# Patient Record
Sex: Female | Born: 1975 | Race: White | Hispanic: No | Marital: Married | Smoking: Former smoker
Health system: Southern US, Community
[De-identification: ages and names within clinical notes are randomized; demographics above are authoritative.]

## PROBLEM LIST (undated history)

## (undated) DIAGNOSIS — F32A Depression, unspecified: Secondary | ICD-10-CM

## (undated) DIAGNOSIS — L409 Psoriasis, unspecified: Secondary | ICD-10-CM

## (undated) DIAGNOSIS — F329 Major depressive disorder, single episode, unspecified: Secondary | ICD-10-CM

## (undated) HISTORY — DX: Psoriasis, unspecified: L40.9

## (undated) HISTORY — PX: TUBAL LIGATION: SHX77

---

## 1993-02-26 HISTORY — PX: LEEP: SHX91

## 2002-12-30 ENCOUNTER — Emergency Department (HOSPITAL_COMMUNITY): Admission: EM | Admit: 2002-12-30 | Discharge: 2002-12-30 | Payer: Self-pay | Admitting: *Deleted

## 2004-12-12 DIAGNOSIS — D229 Melanocytic nevi, unspecified: Secondary | ICD-10-CM

## 2004-12-12 HISTORY — DX: Melanocytic nevi, unspecified: D22.9

## 2005-02-19 ENCOUNTER — Emergency Department (HOSPITAL_COMMUNITY): Admission: EM | Admit: 2005-02-19 | Discharge: 2005-02-19 | Payer: Self-pay | Admitting: Emergency Medicine

## 2011-11-30 ENCOUNTER — Encounter (HOSPITAL_COMMUNITY): Payer: Self-pay

## 2011-11-30 ENCOUNTER — Encounter (HOSPITAL_COMMUNITY)
Admission: RE | Admit: 2011-11-30 | Discharge: 2011-11-30 | Disposition: A | Discharge status: Home / Self Care | Payer: BC Managed Care – PPO | Source: Ambulatory Visit | Attending: Obstetrics and Gynecology | Admitting: Obstetrics and Gynecology

## 2011-11-30 ENCOUNTER — Other Ambulatory Visit: Payer: Self-pay | Admitting: Obstetrics and Gynecology

## 2011-11-30 HISTORY — DX: Depression, unspecified: F32.A

## 2011-11-30 HISTORY — DX: Major depressive disorder, single episode, unspecified: F32.9

## 2011-11-30 LAB — URINALYSIS, ROUTINE W REFLEX MICROSCOPIC
Bilirubin Urine: NEGATIVE
Hgb urine dipstick: NEGATIVE
Nitrite: NEGATIVE
Specific Gravity, Urine: 1.01 (ref 1.005–1.030)
pH: 6 (ref 5.0–8.0)

## 2011-11-30 LAB — BASIC METABOLIC PANEL
CO2: 26 mEq/L (ref 19–32)
Chloride: 104 mEq/L (ref 96–112)
Glucose, Bld: 131 mg/dL — ABNORMAL HIGH (ref 70–99)
Potassium: 4.1 mEq/L (ref 3.5–5.1)
Sodium: 138 mEq/L (ref 135–145)

## 2011-11-30 LAB — CBC
HCT: 33.9 % — ABNORMAL LOW (ref 36.0–46.0)
Hemoglobin: 11.1 g/dL — ABNORMAL LOW (ref 12.0–15.0)
MCH: 29.9 pg (ref 26.0–34.0)
MCV: 91.4 fL (ref 78.0–100.0)
RBC: 3.71 MIL/uL — ABNORMAL LOW (ref 3.87–5.11)

## 2011-11-30 LAB — SURGICAL PCR SCREEN: Staphylococcus aureus: POSITIVE — AB

## 2011-11-30 LAB — HCG, SERUM, QUALITATIVE: Preg, Serum: NEGATIVE

## 2011-11-30 NOTE — Patient Instructions (Addendum)
20 Kristin Chang  11/30/2011   Your procedure is scheduled on:  12/03/2011  Report to [facility] at 1100 AM.  Call this number if you have problems the morning of surgery: [phone]   Remember:   Do not eat food:After Midnight.  May have clear liquids:until Midnight .  Clear liquids include soda, tea, black coffee, apple or grape juice, broth.  Take these medicines the morning of surgery with A SIP OF WATER: none   Do not wear jewelry, make-up or nail polish.  Do not wear lotions, powders, or perfumes. You may wear deodorant.  Do not shave 48 hours prior to surgery. Men may shave face and neck.  Do not bring valuables to the hospital.  Contacts, dentures or bridgework may not be worn into surgery.  Leave suitcase in the car. After surgery it may be brought to your room.  For patients admitted to the hospital, checkout time is 11:00 AM the day of discharge.   Patients discharged the day of surgery will not be allowed to drive home.  Name and phone number of your driver: mother Kristin Chang  Special Instructions: Shower using CHG 2 nights before surgery and the night before surgery.  If you shower the day of surgery use CHG.  Use special wash - you have one bottle of CHG for all showers.  You should use approximately 1/3 of the bottle for each shower.   Please read over the following fact sheets that you were given: Surgical Site Infection Prevention

## 2011-11-30 NOTE — H&P (Addendum)
Kristin Chang is an 36 y.o. female. At this time for resection of a prolapsed I. Leavy Cella which is protruding through the cervix. She will then have hysteroscopy to ensure that the fibroid and any associated polyp is removed. And curettage of the endometrial cavity will be performed. She presented to our office 11/30/2011 complaining of watery vaginal discharge of 3 days' duration. She's had a history of fibroids in the uterus based on ultrasound done at a [facility] last year. She's had an effort at using the IUD which failed. Due to cramping and discomfort. It may be that the fibroid was reason this failed. Additionally, after discussion, she desired tubal ligation to be performed at the same surgery, to allow for permanent sterilization.Failure rate quoted at 1:100. Pertinent Gynecological History: Menses: flow is excessive with use of Both pads or tampons on heaviest days Bleeding: Light pink discharge last 3 days Contraception: none DES exposure: unknown Blood transfusions: none Sexually transmitted diseases: no past history Previous GYN Procedures: IUD use and passed x5 years with good success  Last mammogram: Not require Date:  Last pap: normal Date: 2012 OB History: G1, P1   Menstrual History: Menarche age: 62 No LMP recorded.    No past medical history on file.  No past surgical history on file.  No family history on file.  Social History:  does not have a smoking history on file. She does not have any smokeless tobacco history on file. Her alcohol and drug histories not on file.  Allergies: Allergies not on file  No prescriptions prior to admission    Review of Systems  Constitutional: Negative.     There were no vitals taken for this visit. Physical Exam  Constitutional: She is oriented to person, place, and time. She appears well-developed and well-nourished.  HENT:  Head: Normocephalic and atraumatic.  Eyes: Pupils are equal, round, and reactive to light.    Cardiovascular: Normal rate and regular rhythm.   Respiratory: Effort normal.  GI: Soft. She exhibits no distension. There is no tenderness. There is no guarding.  Genitourinary: Vagina normal and uterus normal.       Large 5cmx5cm fibroid prolapsed through the cervix Uterus difficult to assess due to fibroid  Neurological: She is alert and oriented to person, place, and time. She has normal reflexes.  Psychiatric: She has a normal mood and affect. Her behavior is normal. Judgment and thought content normal.    No results found for this or any previous visit (from the past 24 hour(s)).  No results found.  Assessment/Plan: Prolapsed endometrial fibroid, 4 hysteroscopy excision of polyp and dilation and curettage on Monday, 12/02/2028  Nigil Braman V 11/30/2011, 2:14 PM

## 2011-12-03 ENCOUNTER — Ambulatory Visit (HOSPITAL_COMMUNITY)
Admission: RE | Admit: 2011-12-03 | Discharge: 2011-12-03 | Disposition: A | Discharge status: Home / Self Care | Payer: BC Managed Care – PPO | Source: Ambulatory Visit | Attending: Obstetrics and Gynecology | Admitting: Obstetrics and Gynecology

## 2011-12-03 ENCOUNTER — Encounter (HOSPITAL_COMMUNITY): Payer: Self-pay | Admitting: *Deleted

## 2011-12-03 ENCOUNTER — Ambulatory Visit (HOSPITAL_COMMUNITY): Payer: BC Managed Care – PPO | Admitting: Anesthesiology

## 2011-12-03 ENCOUNTER — Encounter (HOSPITAL_COMMUNITY): Payer: Self-pay | Admitting: Anesthesiology

## 2011-12-03 ENCOUNTER — Encounter (HOSPITAL_COMMUNITY)
Admission: RE | Disposition: A | Discharge status: Home / Self Care | Payer: Self-pay | Source: Ambulatory Visit | Attending: Obstetrics and Gynecology

## 2011-12-03 DIAGNOSIS — Z302 Encounter for sterilization: Secondary | ICD-10-CM

## 2011-12-03 DIAGNOSIS — D260 Other benign neoplasm of cervix uteri: Secondary | ICD-10-CM | POA: Insufficient documentation

## 2011-12-03 DIAGNOSIS — D259 Leiomyoma of uterus, unspecified: Secondary | ICD-10-CM | POA: Diagnosis present

## 2011-12-03 HISTORY — PX: LAPAROSCOPIC TUBAL LIGATION: SHX1937

## 2011-12-03 HISTORY — PX: HYSTEROSCOPY W/D&C: SHX1775

## 2011-12-03 SURGERY — DILATATION AND CURETTAGE /HYSTEROSCOPY
Anesthesia: General | Site: Vagina | Wound class: Clean Contaminated

## 2011-12-03 MED ORDER — PROPOFOL 10 MG/ML IV EMUL
INTRAVENOUS | Status: AC
  Filled 2011-12-03: qty 20

## 2011-12-03 MED ORDER — FENTANYL CITRATE 0.05 MG/ML IJ SOLN
INTRAMUSCULAR | Status: AC
  Filled 2011-12-03: qty 5

## 2011-12-03 MED ORDER — FENTANYL CITRATE 0.05 MG/ML IJ SOLN
INTRAMUSCULAR | Status: AC
  Filled 2011-12-03: qty 2

## 2011-12-03 MED ORDER — ONDANSETRON HCL 4 MG/2ML IJ SOLN: 4.0000 mg | Freq: Once | INTRAMUSCULAR | Status: DC | PRN

## 2011-12-03 MED ORDER — CEFAZOLIN SODIUM-DEXTROSE 2-3 GM-% IV SOLR
2.0000 g | INTRAVENOUS | Status: AC
  Administered 2011-12-03: 2 g via INTRAVENOUS

## 2011-12-03 MED ORDER — ROCURONIUM BROMIDE 100 MG/10ML IV SOLN
INTRAVENOUS | Status: DC | PRN
  Administered 2011-12-03: 35 mg via INTRAVENOUS

## 2011-12-03 MED ORDER — LIDOCAINE HCL (PF) 1 % IJ SOLN
INTRAMUSCULAR | Status: AC
  Filled 2011-12-03: qty 5

## 2011-12-03 MED ORDER — SODIUM CHLORIDE 0.9 % IR SOLN
Status: DC | PRN
  Administered 2011-12-03: 1000 mL

## 2011-12-03 MED ORDER — BUPIVACAINE-EPINEPHRINE PF 0.5-1:200000 % IJ SOLN
INTRAMUSCULAR | Status: AC
  Filled 2011-12-03: qty 10

## 2011-12-03 MED ORDER — LACTATED RINGERS IV SOLN
INTRAVENOUS | Status: DC
  Administered 2011-12-03: 12:00:00 via INTRAVENOUS

## 2011-12-03 MED ORDER — ONDANSETRON HCL 4 MG/2ML IJ SOLN
4.0000 mg | Freq: Once | INTRAMUSCULAR | Status: AC
  Administered 2011-12-03: 4 mg via INTRAVENOUS

## 2011-12-03 MED ORDER — FENTANYL CITRATE 0.05 MG/ML IJ SOLN
25.0000 ug | INTRAMUSCULAR | Status: DC | PRN
  Administered 2011-12-03 (×2): 50 ug via INTRAVENOUS

## 2011-12-03 MED ORDER — LIDOCAINE HCL 1 % IJ SOLN
INTRAMUSCULAR | Status: DC | PRN
  Administered 2011-12-03: 50 mg via INTRADERMAL

## 2011-12-03 MED ORDER — ROCURONIUM BROMIDE 50 MG/5ML IV SOLN
INTRAVENOUS | Status: AC
  Filled 2011-12-03: qty 1

## 2011-12-03 MED ORDER — FENTANYL CITRATE 0.05 MG/ML IJ SOLN
INTRAMUSCULAR | Status: DC | PRN
  Administered 2011-12-03: 50 ug via INTRAVENOUS
  Administered 2011-12-03: 150 ug via INTRAVENOUS
  Administered 2011-12-03 (×2): 25 ug via INTRAVENOUS

## 2011-12-03 MED ORDER — MUPIROCIN 2 % EX OINT
TOPICAL_OINTMENT | CUTANEOUS | Status: AC
  Filled 2011-12-03: qty 22

## 2011-12-03 MED ORDER — ONDANSETRON HCL 4 MG/2ML IJ SOLN
INTRAMUSCULAR | Status: AC
  Filled 2011-12-03: qty 2

## 2011-12-03 MED ORDER — MIDAZOLAM HCL 2 MG/2ML IJ SOLN
INTRAMUSCULAR | Status: AC
  Filled 2011-12-03: qty 2

## 2011-12-03 MED ORDER — LACTATED RINGERS IV SOLN
INTRAVENOUS | Status: DC | PRN
  Administered 2011-12-03 (×2): via INTRAVENOUS

## 2011-12-03 MED ORDER — GLYCOPYRROLATE 0.2 MG/ML IJ SOLN
INTRAMUSCULAR | Status: AC
  Filled 2011-12-03: qty 2

## 2011-12-03 MED ORDER — SODIUM CHLORIDE 0.9 % IR SOLN
Status: DC | PRN
  Administered 2011-12-03: 3000 mL

## 2011-12-03 MED ORDER — IBUPROFEN 600 MG PO TABS: 600.0000 mg | ORAL_TABLET | Freq: Four times a day (QID) | ORAL | Status: DC | PRN

## 2011-12-03 MED ORDER — GLYCOPYRROLATE 0.2 MG/ML IJ SOLN
INTRAMUSCULAR | Status: DC | PRN
  Administered 2011-12-03: 0.2 mg via INTRAVENOUS
  Administered 2011-12-03: .4 mg via INTRAVENOUS

## 2011-12-03 MED ORDER — OXYCODONE-ACETAMINOPHEN 5-325 MG PO TABS: 1.0000 | ORAL_TABLET | ORAL | Status: DC | PRN

## 2011-12-03 MED ORDER — CEFAZOLIN SODIUM-DEXTROSE 2-3 GM-% IV SOLR
INTRAVENOUS | Status: AC
  Filled 2011-12-03: qty 50

## 2011-12-03 MED ORDER — PROPOFOL 10 MG/ML IV EMUL
INTRAVENOUS | Status: DC | PRN
  Administered 2011-12-03: 180 mg via INTRAVENOUS

## 2011-12-03 MED ORDER — NEOSTIGMINE METHYLSULFATE 1 MG/ML IJ SOLN
INTRAMUSCULAR | Status: AC
  Filled 2011-12-03: qty 10

## 2011-12-03 MED ORDER — BUPIVACAINE-EPINEPHRINE PF 0.5-1:200000 % IJ SOLN
INTRAMUSCULAR | Status: DC | PRN
  Administered 2011-12-03: 15 mL

## 2011-12-03 MED ORDER — MIDAZOLAM HCL 2 MG/2ML IJ SOLN
1.0000 mg | INTRAMUSCULAR | Status: DC | PRN
  Administered 2011-12-03: 2 mg via INTRAVENOUS

## 2011-12-03 MED ORDER — NEOSTIGMINE METHYLSULFATE 1 MG/ML IJ SOLN
INTRAMUSCULAR | Status: DC | PRN
  Administered 2011-12-03: 3 mg via INTRAVENOUS

## 2011-12-03 SURGICAL SUPPLY — 37 items
BAG HAMPER (MISCELLANEOUS) ×3 IMPLANT
BLADE SURG SZ11 CARB STEEL (BLADE) ×2 IMPLANT
CLOTH BEACON ORANGE TIMEOUT ST (SAFETY) ×3 IMPLANT
COVER LIGHT HANDLE STERIS (MISCELLANEOUS) ×6 IMPLANT
COVER MAYO STAND XLG (DRAPE) ×1 IMPLANT
DECANTER SPIKE VIAL GLASS SM (MISCELLANEOUS) ×3 IMPLANT
DRESSING COVERLET 3X1 FLEXIBLE (GAUZE/BANDAGES/DRESSINGS) ×2 IMPLANT
FALOPE-RING BAND KIT ×1 IMPLANT
FORMALIN 10 PREFIL 120ML (MISCELLANEOUS) ×3 IMPLANT
GLOVE BIOGEL PI IND STRL 7.5 (GLOVE) IMPLANT
GLOVE BIOGEL PI INDICATOR 7.5 (GLOVE) ×1
GLOVE ECLIPSE 7.0 STRL STRAW (GLOVE) ×2 IMPLANT
GLOVE ECLIPSE 9.0 STRL (GLOVE) ×4 IMPLANT
GLOVE EXAM NITRILE MD LF STRL (GLOVE) ×2 IMPLANT
GLOVE INDICATOR STER SZ 9 (GLOVE) ×5 IMPLANT
GOWN STRL REIN 3XL LVL4 (GOWN DISPOSABLE) ×4 IMPLANT
GOWN STRL REIN XL XLG (GOWN DISPOSABLE) ×3 IMPLANT
INST SET HYSTEROSCOPY (KITS) ×3 IMPLANT
IV NS IRRIG 3000ML ARTHROMATIC (IV SOLUTION) ×3 IMPLANT
KIT ROOM TURNOVER APOR (KITS) ×3 IMPLANT
MANIFOLD NEPTUNE II (INSTRUMENTS) ×3 IMPLANT
NS IRRIG 1000ML POUR BTL (IV SOLUTION) ×3 IMPLANT
PACK PERI GYN (CUSTOM PROCEDURE TRAY) ×3 IMPLANT
PAD ARMBOARD 7.5X6 YLW CONV (MISCELLANEOUS) ×3 IMPLANT
PAD TELFA 3X4 1S STER (GAUZE/BANDAGES/DRESSINGS) ×3 IMPLANT
RING FALLOPIAN BANDS (Ring) ×2 IMPLANT
SET BASIN LINEN APH (SET/KITS/TRAYS/PACK) ×3 IMPLANT
SET CYSTO W/LG BORE CLAMP LF (SET/KITS/TRAYS/PACK) ×3 IMPLANT
SOLUTION ANTI FOG 6CC (MISCELLANEOUS) ×2 IMPLANT
STRIP CLOSURE SKIN 1/4X3 (GAUZE/BANDAGES/DRESSINGS) ×2 IMPLANT
SUT VIC AB 4-0 PS2 27 (SUTURE) ×2 IMPLANT
SYR BULB IRRIGATION 50ML (SYRINGE) ×1 IMPLANT
SYR CONTROL 10ML LL (SYRINGE) ×2 IMPLANT
SYRINGE 10CC LL (SYRINGE) ×2 IMPLANT
TROCAR KII 8X100ML NONTHREADED (TROCAR) ×1 IMPLANT
TROCAR Z-THREAD FIOS 5X100MM (TROCAR) ×1 IMPLANT
TUBING INSUFFLATION HIGH FLOW (TUBING) ×1 IMPLANT

## 2011-12-03 NOTE — Op Note (Signed)
See operative details included in brief operative note 

## 2011-12-03 NOTE — Preoperative (Signed)
Beta Blockers   Reason not to administer Beta Blockers:Not Applicable 

## 2011-12-03 NOTE — Anesthesia Preprocedure Evaluation (Addendum)
Anesthesia Evaluation  Patient identified by MRN, date of birth, ID band Patient awake    Reviewed: Allergy & Precautions, H&P , NPO status , Patient's Chart, lab work & pertinent test results  Airway Mallampati: I TM Distance: >3 FB Neck ROM: Full    Dental  (+) Teeth Intact and Implants,    Pulmonary neg pulmonary ROS,  breath sounds clear to auscultation        Cardiovascular negative cardio ROS  Rhythm:Regular     Neuro/Psych PSYCHIATRIC DISORDERS Depression    GI/Hepatic negative GI ROS,   Endo/Other    Renal/GU      Musculoskeletal   Abdominal   Peds  Hematology   Anesthesia Other Findings   Reproductive/Obstetrics                           Anesthesia Physical Anesthesia Plan  ASA: II  Anesthesia Plan: General   Post-op Pain Management:    Induction: Intravenous  Airway Management Planned: Oral ETT  Additional Equipment:   Intra-op Plan:   Post-operative Plan: Extubation in OR  Informed Consent: I have reviewed the patients History and Physical, chart, labs and discussed the procedure including the risks, benefits and alternatives for the proposed anesthesia with the patient or authorized representative who has indicated his/her understanding and acceptance.     Plan Discussed with:   Anesthesia Plan Comments:         Anesthesia Quick Evaluation

## 2011-12-03 NOTE — Anesthesia Postprocedure Evaluation (Signed)
  Anesthesia Post-op Note  Patient: Kristin Chang  Procedure(s) Performed: Procedure(s) (LRB) with comments: DILATATION AND CURETTAGE /HYSTEROSCOPY (N/A) - Hysteroscopy with excision of prolapsed cervical fibroid, done at 1342 LAPAROSCOPIC TUBAL LIGATION (N/A) - started at 1346  Patient Location: PACU  Anesthesia Type: General  Level of Consciousness: awake, alert  and oriented  Airway and Oxygen Therapy: Patient Spontanous Breathing and Patient connected to face mask oxygen  Post-op Pain: mild  Post-op Assessment: Post-op Vital signs reviewed, Patient's Cardiovascular Status Stable, Respiratory Function Stable, Patent Airway and No signs of Nausea or vomiting  Post-op Vital Signs: Reviewed and stable  Complications: No apparent anesthesia complications

## 2011-12-03 NOTE — Brief Op Note (Signed)
12/03/2011  2:21 PM  PATIENT:  Kristin Chang  35 y.o. female  PRE-OPERATIVE DIAGNOSIS:  cervical fibroid, prolapsed. Desire for sterilization    POST-OPERATIVE DIAGNOSIS:  cervical fibroid, prolapsed,. Desire for sterilization.  PROCEDURE:  Procedure(s) (LRB) with comments: DILATATION AND CURETTAGE /HYSTEROSCOPY (N/A) - Hysteroscopy with excision of prolapsed cervical fibroid, done at 1342 LAPAROSCOPIC TUBAL LIGATION (N/A) - started at 1346  SURGEON:  Surgeon(s) and Role:    * Tilda Burrow, MD - Primary  PHYSICIAN ASSISTANT:  ASSISTANTS: [place] CST  ANESTHESIA:   local and general  EBL:  Total I/O In: 1000 [I.V.:1000] Out: -  BLOOD ADMINISTERED:none DRAINS: none   LOCAL MEDICATIONS USED:  MARCAINE    and Amount: 20 ml SPECIMEN:  Source of Specimen:  Prolapsed fibroid  DISPOSITION OF SPECIMEN:  PATHOLOGY COUNTS:  YES  TOURNIQUET:  * No tourniquets in log * DICTATION: .Dragon Dictation Patient was taken to the operating room prepped and draped for combined abdominal and vaginal procedure timeout conducted and antibiotic administered. 2 g Ancef IV were administered. Attention was first directed to the vaginal area where speculum was inserted, cervix grasped behind the large prolapsed fibroid, and the hysteroscope slid past the fibroid in order to try to identify the size and orientation of the suspected fibroid stalk. Due to technical difficulties it was not very helpful to try to use hysteroscope. It was felt that the uterine cavity was relatively short period. Using this information we're able to grasp the fibroid with single-tooth tenaculum, and use Metzenbaum scissors to delineate the upper most portion of the polyp which appeared to be coming from the fundal portion of the uterus, and in a careful intrauterine transection of the broad-based stalk. Specimen was passed off as surgical specimen, and hysteroscope once again attempted to try to further delineate the uterine  cavity. There was a moderate amount of bleeding from the base of the fibroid stalk, but intermittently adequate visualization allowed [place] to ensure that there was no suspicion of perforation. The right tubal ostia was visualized, but the left could not be visualized.  The vaginal portion of procedure was considered complete.  The cervix was grasped with ring forceps and single-tooth tenaculum for uterine manipulation, then the abdominal portion the case initiated. Surgeon redraped and repositioned to the abdominal site where an infraumbilical vertical 1 cm skin incision was made as well as a  transversel 8 millimeter abdominal skin incision performed. The direct insertion technique was used with the umbilical trocar with care due to patient's limb body habitus, and the peritoneal cavity was easily entered oriented and the trocar toward the pelvis, below the sacral promontory. Abdominal contents were inspected carefully and no evidence of bleeding identified. The suprapubic trocar was placed by direct visualization, and attention to the pelvis then showed a irregular fibroid uterus relatively short and broad in dimensions, with both a fundal submucosal fibroid and smaller  multiple fibroids. The uterus could be manipulated sufficiently to see the adnexal structures with the fallopian tube was visualized to their fimbriated end and ovaries visualized as grossly normal. The uterus was not particularly mobile. Falope ring was applied to the midportion of each tube and infiltrated percutaneously with Marcaine solution 2 cc on each side. Deflation the abdomen and instilling 125 cc of saline was then performed then the subcutaneous closure the skin incisions performed completing the procedure. Sponge and needle counts were correct patient to recovery room in stable condition .   PLAN OF CARE: Discharge to home  after PACU PATIENT DISPOSITION:  PACU - hemodynamically stable.   Delay start of Pharmacological VTE agent  (>24hrs) due to surgical blood loss or risk of bleeding: not applicable

## 2011-12-03 NOTE — Interval H&P Note (Signed)
History and Physical Interval Note:  12/03/2011 12:53 PM  Kristin Chang  has presented today for surgery, with the diagnosis of cervical fibroid, prolapsed  The various methods of treatment have been discussed with the patient and family. After consideration of risks, benefits and other options for treatment, the patient has consented to  Procedure(s) (LRB) with comments: DILATATION AND CURETTAGE /HYSTEROSCOPY (N/A) - Hysteroscopy with excision of prolapsed cervical fibroid as a surgical intervention .  The patient's history has been reviewed, patient examined, no change in status, stable for surgery.  I have reviewed the patient's chart and labs.  Questions were answered to the patient's satisfaction.    Tubal ligation has been added to the procedure, as per pt request on Friday. Tilda Burrow

## 2011-12-03 NOTE — Transfer of Care (Signed)
Immediate Anesthesia Transfer of Care Note  Patient: Kristin Chang  Procedure(s) Performed: Procedure(s) (LRB) with comments: DILATATION AND CURETTAGE /HYSTEROSCOPY (N/A) - Hysteroscopy with excision of prolapsed cervical fibroid, done at 1342 LAPAROSCOPIC TUBAL LIGATION (N/A) - started at 1346  Patient Location: PACU  Anesthesia Type: General  Level of Consciousness: awake, alert  and oriented  Airway & Oxygen Therapy: Patient Spontanous Breathing and Patient connected to face mask oxygen  Post-op Assessment: Report given to PACU RN  Post vital signs: Reviewed  Complications: No apparent anesthesia complications

## 2011-12-03 NOTE — Anesthesia Postprocedure Evaluation (Signed)
  Anesthesia Post-op Note  Patient: Kristin Chang  Procedure(s) Performed: Procedure(s) (LRB) with comments: DILATATION AND CURETTAGE /HYSTEROSCOPY (N/A) - Hysteroscopy with excision of prolapsed cervical fibroid, done at 1342 LAPAROSCOPIC TUBAL LIGATION (N/A) - started at 1346  Patient Location: PACU  Anesthesia Type: General  Level of Consciousness: awake  Airway and Oxygen Therapy: Patient Spontanous Breathing and Patient connected to face mask oxygen  Post-op Pain: mild  Post-op Assessment: Post-op Vital signs reviewed, Patient's Cardiovascular Status Stable, Respiratory Function Stable, Patent Airway and No signs of Nausea or vomiting  Post-op Vital Signs: Reviewed and stable  Complications: No apparent anesthesia complications

## 2011-12-05 ENCOUNTER — Encounter (HOSPITAL_COMMUNITY): Payer: Self-pay | Admitting: Obstetrics and Gynecology

## 2012-05-16 ENCOUNTER — Other Ambulatory Visit: Payer: Self-pay | Admitting: Obstetrics and Gynecology

## 2012-05-19 ENCOUNTER — Other Ambulatory Visit: Payer: Self-pay | Admitting: Obstetrics and Gynecology

## 2012-05-19 ENCOUNTER — Encounter: Payer: Self-pay | Admitting: Obstetrics and Gynecology

## 2012-05-19 ENCOUNTER — Other Ambulatory Visit (HOSPITAL_COMMUNITY)
Admission: RE | Admit: 2012-05-19 | Discharge: 2012-05-19 | Disposition: A | Discharge status: Home / Self Care | Payer: BC Managed Care – PPO | Source: Ambulatory Visit | Attending: Obstetrics and Gynecology | Admitting: Obstetrics and Gynecology

## 2012-05-19 ENCOUNTER — Ambulatory Visit (INDEPENDENT_AMBULATORY_CARE_PROVIDER_SITE_OTHER): Payer: BC Managed Care – PPO | Admitting: Obstetrics and Gynecology

## 2012-05-19 VITALS — BP 116/74 | Resp 16 | Ht 66.0 in | Wt 136.6 lb

## 2012-05-19 DIAGNOSIS — Z1151 Encounter for screening for human papillomavirus (HPV): Secondary | ICD-10-CM | POA: Insufficient documentation

## 2012-05-19 DIAGNOSIS — Z0000 Encounter for general adult medical examination without abnormal findings: Secondary | ICD-10-CM

## 2012-05-19 DIAGNOSIS — Z01419 Encounter for gynecological examination (general) (routine) without abnormal findings: Secondary | ICD-10-CM | POA: Insufficient documentation

## 2012-05-19 MED ORDER — ALPRAZOLAM 0.5 MG PO TABS: 0.5000 mg | ORAL_TABLET | Freq: Every day | ORAL | Status: DC

## 2012-05-19 MED ORDER — CITALOPRAM HYDROBROMIDE 40 MG PO TABS: 40.0000 mg | ORAL_TABLET | Freq: Every day | ORAL | Status: DC

## 2012-05-19 NOTE — Progress Notes (Signed)
Subjective:     Kristin Chang is a 37 y.o. female here for a routine exam.  Current complaints: none.  Personal health questionnaire reviewed: yes.   Gynecologic History Patient's last menstrual period was 05/07/2012. Contraception: tubal ligation Last Pap: 2013. Results were: normal Last mammogram: . Results were:   Obstetric History OB History   Grav Para Term Preterm Abortions TAB SAB Ect Mult Living                   Psoriasis, on ebrel. Has several med fequests:xanax .5 used 56/55yr  celexa 40 daily on x yrs. Notes difference if doesn't take Review of Systems A comprehensive review of systems was negative.    Objective:    General appearance: alert, cooperative, appears stated age and no distress Head: Normocephalic, without obvious abnormality, atraumatic Neck: no adenopathy, no JVD, supple, symmetrical, trachea midline and thyroid not enlarged, symmetric, no tenderness/mass/nodules Lungs: normal resp activity Breasts: normal appearance, no masses or tenderness, Normal to palpation without dominant masses Abdomen: soft, non-tender; bowel sounds normal; no masses,  no organomegaly Pelvic: cervix normal in appearance, external genitalia normal, no adnexal masses or tenderness, no cervical motion tenderness, rectovaginal septum normal, uterus normal size, shape, and consistency and vagina normal without discharge Extremities: extensive freckles, none of concern, sees dermatology Skin: Skin color, texture, turgor normal. No rashes or lesions or lots of freckles Pt thrilled with light mensed s/p resection of prolapsed fibroid     Assessment:    Healthy female exam.  S/p resection of prolapsed fibroid 2013 [facility]   Plan:

## 2012-06-09 ENCOUNTER — Telehealth: Payer: Self-pay | Admitting: Obstetrics and Gynecology

## 2012-06-09 NOTE — Telephone Encounter (Signed)
RX for Celexa e -scribed to [facility], [place] 05/19/2012 per pharmacy. Pt made aware.

## 2013-05-21 ENCOUNTER — Encounter: Payer: Self-pay | Admitting: Obstetrics and Gynecology

## 2013-05-21 ENCOUNTER — Ambulatory Visit (INDEPENDENT_AMBULATORY_CARE_PROVIDER_SITE_OTHER): Payer: BC Managed Care – PPO | Admitting: Obstetrics and Gynecology

## 2013-05-21 ENCOUNTER — Encounter (INDEPENDENT_AMBULATORY_CARE_PROVIDER_SITE_OTHER): Payer: Self-pay

## 2013-05-21 VITALS — BP 90/60 | Ht 67.0 in | Wt 138.8 lb

## 2013-05-21 DIAGNOSIS — Z0000 Encounter for general adult medical examination without abnormal findings: Secondary | ICD-10-CM

## 2013-05-21 DIAGNOSIS — Z01419 Encounter for gynecological examination (general) (routine) without abnormal findings: Secondary | ICD-10-CM

## 2013-05-21 MED ORDER — CITALOPRAM HYDROBROMIDE 40 MG PO TABS: 40.0000 mg | ORAL_TABLET | Freq: Every day | ORAL | Status: DC

## 2013-05-21 MED ORDER — ALPRAZOLAM 0.5 MG PO TABS: 0.5000 mg | ORAL_TABLET | Freq: Every day | ORAL | Status: DC

## 2013-05-21 NOTE — Progress Notes (Signed)
This chart was scribed by Ludger Nutting, Medical Scribe, for Dr. Mallory Shirk on 05/21/13 at 11:39 AM. This chart was reviewed by Dr. Mallory Shirk and is accurate.   Assessment:  Annual Gyn Exam   Plan:  1. pap smear done last year and was normal, next pap due in 2 years  2. return annually or prn 3    Annual mammogram advised starting at age 40  Subjective:  Kristin Chang is a 38 y.o. female No obstetric history on file. who presents for annual exam. Patient's last menstrual period was 04/26/2013. She states they last 3-5 days and are manageable.  The patient has no complaints today.   The following portions of the patient's history were reviewed and updated as appropriate: allergies, current medications, past family history, past medical history, past social history, past surgical history and problem list.  Review of Systems Constitutional: negative Gastrointestinal: negative Genitourinary: Negative   Objective:  BP 90/60  Ht 5\' 7"  (1.702 m)  Wt 138 lb 12.8 oz (62.959 kg)  BMI 21.73 kg/m2  LMP 04/26/2013   BMI: Body mass index is 21.73 kg/(m^2).  General Appearance: Alert, appropriate appearance for age. No acute distress HEENT: Grossly normal Neck / Thyroid:  Cardiovascular: RRR; normal S1, S2, no murmur Lungs: CTA bilaterally Back: No CVAT Breast Exam: No dimpling, nipple retraction or discharge. No masses or nodes., Normal to inspection, Normal breast tissue bilaterally and No masses or nodes.No dimpling, nipple retraction or discharge. Gastrointestinal: Soft, non-tender, no masses or organomegaly Pelvic Exam: External genitalia: normal general appearance Vaginal: normal mucosa without prolapse or lesions, normal without tenderness, induration or masses and normal rugae Cervix: normal appearance Adnexa: normal bimanual exam Uterus: normal single, nontender Rectovaginal: not indicated Lymphatic Exam: Non-palpable nodes in neck, clavicular, axillary, or inguinal regions   Skin: no rash or abnormalities Neurologic: Normal gait and speech, no tremor  Psychiatric: Alert and oriented, appropriate affect.  Urinalysis:Not done  Mallory Shirk. MD Pgr [phone] 11:39 AM

## 2013-06-03 ENCOUNTER — Other Ambulatory Visit: Payer: Self-pay | Admitting: Physician Assistant

## 2013-06-05 ENCOUNTER — Other Ambulatory Visit: Payer: Self-pay

## 2013-06-05 DIAGNOSIS — Z1231 Encounter for screening mammogram for malignant neoplasm of breast: Secondary | ICD-10-CM

## 2013-07-01 ENCOUNTER — Encounter (INDEPENDENT_AMBULATORY_CARE_PROVIDER_SITE_OTHER): Payer: Self-pay

## 2013-07-01 ENCOUNTER — Ambulatory Visit
Admission: RE | Admit: 2013-07-01 | Discharge: 2013-07-01 | Disposition: A | Discharge status: Home / Self Care | Payer: BC Managed Care – PPO | Source: Ambulatory Visit

## 2013-07-01 DIAGNOSIS — Z1231 Encounter for screening mammogram for malignant neoplasm of breast: Secondary | ICD-10-CM

## 2013-07-02 ENCOUNTER — Other Ambulatory Visit: Payer: Self-pay | Admitting: Physician Assistant

## 2013-07-16 ENCOUNTER — Other Ambulatory Visit: Payer: Self-pay | Admitting: Physician Assistant

## 2013-08-16 ENCOUNTER — Other Ambulatory Visit: Payer: Self-pay | Admitting: Obstetrics and Gynecology

## 2013-08-17 ENCOUNTER — Other Ambulatory Visit: Payer: Self-pay | Admitting: Obstetrics and Gynecology

## 2013-08-18 ENCOUNTER — Other Ambulatory Visit: Payer: Self-pay | Admitting: Obstetrics and Gynecology

## 2013-08-19 ENCOUNTER — Telehealth: Payer: Self-pay | Admitting: Obstetrics and Gynecology

## 2013-08-19 NOTE — Telephone Encounter (Signed)
Pt states that the pharmacy has sent a refill request on Monday and they still have not received from [facility].

## 2013-08-20 ENCOUNTER — Other Ambulatory Visit: Payer: Self-pay | Admitting: Obstetrics and Gynecology

## 2013-08-20 DIAGNOSIS — F419 Anxiety disorder, unspecified: Secondary | ICD-10-CM

## 2013-08-20 MED ORDER — ALPRAZOLAM 0.5 MG PO TABS: 0.5000 mg | ORAL_TABLET | Freq: Every day | ORAL | Status: DC

## 2013-08-20 NOTE — Telephone Encounter (Signed)
I called [facility] and spoke with them about the Rx, the last Rx they had on file was from March of 2014. Pt aware that I spoke to [facility] and that the Rx must have not been faxed or signed and given to her. I advised the pt that I would speak to him again about this problem and call her back by the end of the day.  Pt verbalized understanding.

## 2013-08-20 NOTE — Progress Notes (Signed)
LMOM that Rx was being faxed to her pharmacy.

## 2014-06-10 ENCOUNTER — Other Ambulatory Visit: Payer: Self-pay

## 2014-06-10 DIAGNOSIS — Z1231 Encounter for screening mammogram for malignant neoplasm of breast: Secondary | ICD-10-CM

## 2014-06-19 ENCOUNTER — Other Ambulatory Visit: Payer: Self-pay | Admitting: Obstetrics and Gynecology

## 2014-06-21 ENCOUNTER — Telehealth: Payer: Self-pay | Admitting: Obstetrics and Gynecology

## 2014-06-21 MED ORDER — CITALOPRAM HYDROBROMIDE 40 MG PO TABS: 40.0000 mg | ORAL_TABLET | Freq: Every day | ORAL | Status: DC

## 2014-06-21 NOTE — Telephone Encounter (Signed)
Dr. Glo Herring gave a verbal order to refill celexa. I called and informed the pt of this.

## 2014-06-22 NOTE — Telephone Encounter (Signed)
Given refil rx x 1 yr yesterday

## 2014-06-25 ENCOUNTER — Other Ambulatory Visit (HOSPITAL_COMMUNITY)
Admission: RE | Admit: 2014-06-25 | Discharge: 2014-06-25 | Disposition: A | Discharge status: Home / Self Care | Payer: BC Managed Care – PPO | Source: Ambulatory Visit | Attending: Obstetrics and Gynecology | Admitting: Obstetrics and Gynecology

## 2014-06-25 ENCOUNTER — Encounter: Payer: Self-pay | Admitting: Obstetrics and Gynecology

## 2014-06-25 ENCOUNTER — Ambulatory Visit (INDEPENDENT_AMBULATORY_CARE_PROVIDER_SITE_OTHER): Payer: BC Managed Care – PPO | Admitting: Obstetrics and Gynecology

## 2014-06-25 VITALS — BP 100/60 | Ht 67.0 in | Wt 138.0 lb

## 2014-06-25 DIAGNOSIS — Z0000 Encounter for general adult medical examination without abnormal findings: Secondary | ICD-10-CM

## 2014-06-25 DIAGNOSIS — Z01419 Encounter for gynecological examination (general) (routine) without abnormal findings: Secondary | ICD-10-CM | POA: Diagnosis present

## 2014-06-25 DIAGNOSIS — Z1151 Encounter for screening for human papillomavirus (HPV): Secondary | ICD-10-CM | POA: Insufficient documentation

## 2014-06-25 NOTE — Progress Notes (Signed)
Patient ID: Kristin Chang, female   DOB: Apr 09, 1975, 39 y.o.   MRN: [id number]   Assessment:  Annual Gyn Exam  screening labs this year Plan:  1. pap smear done, next pap due in 3 years 2. return annually or prn 3    Annual mammogram advised Subjective:  Kristin Chang is a 39 y.o. female, s/p tubal ligation, No obstetric history on file. who presents for annual exam. Patient's last menstrual period was 06/11/2014. The patient has complaints today of irregular menses. Pt reports recent fluctuation in her mood as an associated symptom. She denies a history of irregular periods. Pt had vaginal bleeding starting on 4/8 and 4/15 this month.  Pt's mother had breast cancer at age 44 and had a double mastectomy. Pt had her first mammogram at the [facility] in [place] last year which was normal.   The following portions of the patient's history were reviewed and updated as appropriate: allergies, current medications, past family history, past medical history, past social history, past surgical history and problem list. Past Medical History  Diagnosis Date  . Depression     Past Surgical History  Procedure Laterality Date  . [place]  1995  . Hysteroscopy w/d&c  12/03/2011    Procedure: DILATATION AND CURETTAGE /HYSTEROSCOPY;  Surgeon: Jonnie Kind, MD;  Location: [facility];  Service: Gynecology;  Laterality: N/A;  Hysteroscopy with excision of prolapsed cervical fibroid, done at 1342  . Laparoscopic tubal ligation  12/03/2011    Procedure: LAPAROSCOPIC TUBAL LIGATION;  Surgeon: Jonnie Kind, MD;  Location: [facility];  Service: Gynecology;  Laterality: N/A;  started at 1346  . Tubal ligation       Current outpatient prescriptions:  .  ALPRAZolam (XANAX) 0.5 MG tablet, Take 1 tablet (0.5 mg total) by mouth daily. For anxiety, Disp: 30 tablet, Rfl: 5 .  citalopram (CELEXA) 40 MG tablet, Take 1 tablet (40 mg total) by mouth daily., Disp: 30 tablet, Rfl: 12 .  etanercept (ENBREL) 50 MG/ML  injection, Inject 50 mg into the skin once a week., Disp: , Rfl:   Review of Systems Constitutional: negative Gastrointestinal: negative Genitourinary: negative  Objective:  BP 100/60 mmHg  Ht 5\' 7"  (1.702 m)  Wt 138 lb (62.596 kg)  BMI 21.61 kg/m2  LMP 06/11/2014   BMI: Body mass index is 21.61 kg/(m^2).  General Appearance: Alert, appropriate appearance for age. No acute distress HEENT: Grossly normal Neck / Thyroid:  Cardiovascular: RRR; normal S1, S2, no murmur Lungs: CTA bilaterally Back: No CVAT Breast Exam: No dimpling, nipple retraction or discharge. No masses or nodes. and No masses or nodes.No dimpling, nipple retraction or discharge. Gastrointestinal: Soft, non-tender, no masses or organomegaly Pelvic Exam: Exam deferred. Vulva and vagina appear normal. Bimanual exam reveals normal uterus and adnexa. Vaginal: normal mucosa without prolapse or lesions Cervix: normal appearance Adnexa: normal bimanual exam Uterus: normal single, nontender Rectovaginal: guaiac negative stool obtained Lymphatic Exam: Non-palpable nodes in neck, clavicular, axillary, or inguinal regions  Skin: no rash or abnormalities Neurologic: Normal gait and speech, no tremor  Psychiatric: Alert and oriented, appropriate affect.  Urinalysis:Not done Hemoccult: Negative  Mallory Shirk. MD Pgr [phone] 12:16 PM   This chart was scribed for Jonnie Kind, MD by Tula Nakayama, ED Scribe. This patient was seen in room 1 and the patient's care was started at 12:16 PM.   I personally performed the services described in this documentation, which was SCRIBED in my presence. The recorded information has been reviewed  and considered accurate. It has been edited as necessary during review. Jonnie Kind, MD

## 2014-06-25 NOTE — Progress Notes (Signed)
Patient ID: Kristin Chang, female   DOB: 09/28/1975, 39 y.o.   MRN: [id number] Pt here today for annual exam. Pt states that her periods have become very irregular, having this problem for the past 3-4 months. Pt states that she needs a refill on her xanax.

## 2014-06-25 NOTE — Addendum Note (Signed)
Addended by: Farley Ly on: 06/25/2014 12:43 PM   Modules accepted: Orders

## 2014-06-29 LAB — CYTOLOGY - PAP

## 2014-07-07 ENCOUNTER — Ambulatory Visit
Admission: RE | Admit: 2014-07-07 | Discharge: 2014-07-07 | Disposition: A | Discharge status: Home / Self Care | Payer: BC Managed Care – PPO | Source: Ambulatory Visit

## 2014-07-07 DIAGNOSIS — Z1231 Encounter for screening mammogram for malignant neoplasm of breast: Secondary | ICD-10-CM

## 2014-07-08 ENCOUNTER — Other Ambulatory Visit: Payer: Self-pay | Admitting: Obstetrics and Gynecology

## 2014-07-09 NOTE — Telephone Encounter (Signed)
refil xanax x 30 tabs. Needs appt

## 2014-07-09 NOTE — Progress Notes (Signed)
Pt was given [facility] forms, and will get the blood drawn when her schedule permits

## 2014-07-12 ENCOUNTER — Telehealth: Payer: Self-pay | Admitting: *Deleted

## 2014-07-12 NOTE — Telephone Encounter (Signed)
LMOM for pt to check with her pharmacy and to call us back if she had any questions.

## 2014-08-05 LAB — LIPID PANEL
Chol/HDL Ratio: 1.6 ratio units (ref 0.0–4.4)
Cholesterol, Total: 137 mg/dL (ref 100–199)
HDL: 87 mg/dL (ref >39)
LDL Calculated: 44 mg/dL (ref 0–99)
Triglycerides: 32 mg/dL (ref 0–149)
VLDL Cholesterol Cal: 6 mg/dL (ref 5–40)

## 2014-08-05 LAB — COMPREHENSIVE METABOLIC PANEL
ALT: 11 IU/L (ref 0–32)
AST: 16 IU/L (ref 0–40)
Albumin/Globulin Ratio: 1.6 (ref 1.1–2.5)
Albumin: 4.2 g/dL (ref 3.5–5.5)
Alkaline Phosphatase: 38 IU/L — ABNORMAL LOW (ref 39–117)
BILIRUBIN TOTAL: 0.9 mg/dL (ref 0.0–1.2)
BUN/Creatinine Ratio: 19 (ref 9–23)
BUN: 14 mg/dL (ref 6–24)
CHLORIDE: 101 mmol/L (ref 97–108)
CO2: 24 mmol/L (ref 18–29)
CREATININE: 0.72 mg/dL (ref 0.57–1.00)
Calcium: 8.8 mg/dL (ref 8.7–10.2)
GFR calc Af Amer: 120 mL/min/{1.73_m2} (ref >59)
GFR, EST NON AFRICAN AMERICAN: 104 mL/min/{1.73_m2} (ref >59)
Globulin, Total: 2.6 g/dL (ref 1.5–4.5)
Glucose: 91 mg/dL (ref 65–99)
POTASSIUM: 4.5 mmol/L (ref 3.5–5.2)
SODIUM: 140 mmol/L (ref 134–144)
TOTAL PROTEIN: 6.8 g/dL (ref 6.0–8.5)

## 2014-08-05 LAB — CBC
Hematocrit: 35.2 % (ref 34.0–46.6)
Hemoglobin: 11.8 g/dL (ref 11.1–15.9)
MCH: 29.6 pg (ref 26.6–33.0)
MCHC: 33.5 g/dL (ref 31.5–35.7)
MCV: 88 fL (ref 79–97)
Platelets: 312 10*3/uL (ref 150–379)
RBC: 3.98 x10E6/uL (ref 3.77–5.28)
RDW: 12.7 % (ref 12.3–15.4)
WBC: 4.9 10*3/uL (ref 3.4–10.8)

## 2014-08-05 LAB — TSH: TSH: 1.13 u[IU]/mL (ref 0.450–4.500)

## 2014-08-06 ENCOUNTER — Telehealth: Payer: Self-pay | Admitting: Obstetrics and Gynecology

## 2014-08-06 NOTE — Telephone Encounter (Signed)
Pt informed blood work from 08/04/2014 (TSH, CMET, lipid pain, CBC) WNL. Pt verbalized understanding.

## 2014-09-02 ENCOUNTER — Other Ambulatory Visit: Payer: Self-pay | Admitting: Physician Assistant

## 2014-12-15 ENCOUNTER — Other Ambulatory Visit: Payer: Self-pay | Admitting: Physician Assistant

## 2014-12-15 DIAGNOSIS — C4492 Squamous cell carcinoma of skin, unspecified: Secondary | ICD-10-CM

## 2014-12-15 HISTORY — DX: Squamous cell carcinoma of skin, unspecified: C44.92

## 2015-06-13 ENCOUNTER — Other Ambulatory Visit: Payer: Self-pay

## 2015-06-13 DIAGNOSIS — Z1231 Encounter for screening mammogram for malignant neoplasm of breast: Secondary | ICD-10-CM

## 2015-07-06 ENCOUNTER — Other Ambulatory Visit: Payer: Self-pay | Admitting: Physician Assistant

## 2015-07-08 ENCOUNTER — Encounter: Payer: Self-pay | Admitting: Obstetrics and Gynecology

## 2015-07-08 ENCOUNTER — Ambulatory Visit (INDEPENDENT_AMBULATORY_CARE_PROVIDER_SITE_OTHER): Payer: BC Managed Care – PPO | Admitting: Obstetrics and Gynecology

## 2015-07-08 VITALS — BP 96/62 | HR 54 | Ht 67.0 in | Wt 142.6 lb

## 2015-07-08 DIAGNOSIS — F418 Other specified anxiety disorders: Secondary | ICD-10-CM | POA: Diagnosis not present

## 2015-07-08 MED ORDER — CITALOPRAM HYDROBROMIDE 40 MG PO TABS: 40.0000 mg | ORAL_TABLET | Freq: Every day | ORAL | Status: DC

## 2015-07-08 MED ORDER — ALPRAZOLAM 0.5 MG PO TABS: ORAL_TABLET | ORAL | Status: DC

## 2015-07-08 NOTE — Progress Notes (Signed)
[facility] Visit  @DATE @            Patient name: Kristin Chang MRN [id number]  Date of birth: [birth year]  CC & HPI:  Kristin Chang is a 40 y.o. female presenting today for medication refills of antidepressants. She states she has been on Celexa 40 mg qd for several years, which has been working significantly well for her. Patient states she feels increased anxiety that occurs about a couple days after not taking Celexa.  Patient reports she has only taken Xanax sparingly as needed at work with increased stress, with still 10 tablets left from a 30-day prescription given last 06/2014.   ROS:  Review of Systems  All other systems reviewed and are negative.    Pertinent History Reviewed:   Reviewed: Significant for depression Medical         Past Medical History  Diagnosis Date  . Depression                               Surgical Hx:    Past Surgical History  Procedure Laterality Date  . Leep  1995  . Hysteroscopy w/d&c  12/03/2011    Procedure: DILATATION AND CURETTAGE /HYSTEROSCOPY;  Surgeon: Jonnie Kind, MD;  Location: [facility];  Service: Gynecology;  Laterality: N/A;  Hysteroscopy with excision of prolapsed cervical fibroid, done at 1342  . Laparoscopic tubal ligation  12/03/2011    Procedure: LAPAROSCOPIC TUBAL LIGATION;  Surgeon: Jonnie Kind, MD;  Location: [facility];  Service: Gynecology;  Laterality: N/A;  started at 1346  . Tubal ligation     Medications: Reviewed & Updated - see associated section                       Current outpatient prescriptions:  .  ALPRAZolam (XANAX) 0.5 MG tablet, take 1 tablet once daily for anxiety, Disp: 30 tablet, Rfl: 0 .  citalopram (CELEXA) 40 MG tablet, Take 1 tablet (40 mg total) by mouth daily., Disp: 30 tablet, Rfl: 12 .  etanercept (ENBREL) 50 MG/ML injection, Inject 50 mg into the skin once a week., Disp: , Rfl:    Social History: Reviewed -  reports that she has quit smoking. She has never used smokeless  tobacco.  Objective Findings:  Vitals: Blood pressure 96/62, pulse 54, height 5\' 7"  (1.702 m), weight 142 lb 9.6 oz (64.683 kg), last menstrual period 06/28/2015.  Physical Examination:  Discussed with pt risks and benefits of Celexa and Xanax. At end of discussion, pt had opportunity to ask questions and has no further questions at this time.   Greater than 50% was spent in counseling and coordination of care with the patient. Total time greater than: 15 minutes    Assessment & Plan:   A:  1. Appointment for medication refill of chronic antidepressant medications.   P:  1. Rx Celexa 40 mg x 30 tablets w/ 12 refills. 2. Refill Xanax 0.5 mg 30 tablets w/ 1 refill.  3. Follow-up prn   By signing my name below, I, Stephania Fragmin, attest that this documentation has been prepared under the direction and in the presence of Jonnie Kind, MD. Electronically Signed: Stephania Fragmin, ED Scribe. 07/08/2015. 12:55 PM.  I personally performed the services described in this documentation, which was SCRIBED in my presence. The recorded information has been reviewed and considered accurate. It has been edited as necessary  during review. Jonnie Kind, MD

## 2015-07-21 ENCOUNTER — Ambulatory Visit
Admission: RE | Admit: 2015-07-21 | Discharge: 2015-07-21 | Disposition: A | Discharge status: Home / Self Care | Payer: BC Managed Care – PPO | Source: Ambulatory Visit

## 2015-07-21 DIAGNOSIS — Z1231 Encounter for screening mammogram for malignant neoplasm of breast: Secondary | ICD-10-CM

## 2016-03-07 ENCOUNTER — Other Ambulatory Visit: Payer: Self-pay | Admitting: Physician Assistant

## 2016-04-12 ENCOUNTER — Other Ambulatory Visit: Payer: Self-pay | Admitting: Physician Assistant

## 2016-06-25 ENCOUNTER — Other Ambulatory Visit: Payer: Self-pay | Admitting: Cardiology

## 2016-06-25 DIAGNOSIS — Z1231 Encounter for screening mammogram for malignant neoplasm of breast: Secondary | ICD-10-CM

## 2016-07-08 ENCOUNTER — Other Ambulatory Visit: Payer: Self-pay | Admitting: Obstetrics and Gynecology

## 2016-07-10 ENCOUNTER — Other Ambulatory Visit: Payer: Self-pay | Admitting: *Deleted

## 2016-07-10 MED ORDER — CITALOPRAM HYDROBROMIDE 40 MG PO TABS: 40.0000 mg | ORAL_TABLET | Freq: Every day | ORAL | 12 refills | Status: DC

## 2016-07-10 NOTE — Telephone Encounter (Signed)
refil celexa x 12

## 2016-07-10 NOTE — Telephone Encounter (Signed)
Informed prescription was refilled and sent to pharmacy.

## 2016-07-18 ENCOUNTER — Other Ambulatory Visit: Payer: BC Managed Care – PPO | Admitting: Obstetrics and Gynecology

## 2016-07-24 ENCOUNTER — Ambulatory Visit
Admission: RE | Admit: 2016-07-24 | Discharge: 2016-07-24 | Disposition: A | Discharge status: Home / Self Care | Payer: BC Managed Care – PPO | Source: Ambulatory Visit | Attending: Cardiology | Admitting: Cardiology

## 2016-07-24 DIAGNOSIS — Z1231 Encounter for screening mammogram for malignant neoplasm of breast: Secondary | ICD-10-CM

## 2016-08-01 ENCOUNTER — Other Ambulatory Visit: Payer: BC Managed Care – PPO | Admitting: Obstetrics and Gynecology

## 2016-08-13 ENCOUNTER — Other Ambulatory Visit: Payer: BC Managed Care – PPO | Admitting: Obstetrics and Gynecology

## 2016-08-27 ENCOUNTER — Encounter: Payer: Self-pay | Admitting: Obstetrics and Gynecology

## 2016-08-27 ENCOUNTER — Ambulatory Visit (INDEPENDENT_AMBULATORY_CARE_PROVIDER_SITE_OTHER): Payer: BC Managed Care – PPO | Admitting: Obstetrics and Gynecology

## 2016-08-27 ENCOUNTER — Other Ambulatory Visit (HOSPITAL_COMMUNITY)
Admission: RE | Admit: 2016-08-27 | Discharge: 2016-08-27 | Disposition: A | Discharge status: Home / Self Care | Payer: BC Managed Care – PPO | Source: Ambulatory Visit | Attending: Obstetrics and Gynecology | Admitting: Obstetrics and Gynecology

## 2016-08-27 VITALS — BP 102/60 | HR 50 | Ht 66.5 in | Wt 145.5 lb

## 2016-08-27 DIAGNOSIS — Z01419 Encounter for gynecological examination (general) (routine) without abnormal findings: Secondary | ICD-10-CM | POA: Insufficient documentation

## 2016-08-27 MED ORDER — CITALOPRAM HYDROBROMIDE 40 MG PO TABS: 40.0000 mg | ORAL_TABLET | Freq: Every day | ORAL | 12 refills | Status: DC

## 2016-08-27 NOTE — Progress Notes (Signed)
Assessment:  Annual Gyn Exam  situational stess, stable on celexa Plan:  1. pap smear done, next pap due in 3 years 2. return annually or prn 3    Annual mammogram advised 4.  Refill Celexa Subjective:  Kristin Chang is a 41 y.o. female G1P0101 who presents for annual exam. Patient's last menstrual period was 07/31/2016 (exact date). The patient has no new complaints. Pt's last Pap was in 2016 with negative findings. Pt had a mammogram with negative findings on 07/25/2015. Pt reports weekly BM with stool that isn't easily passed. Pt states her recent menstrual periods last about one day but are not of concern for her. Pt also needs a refill for Celexa.   The following portions of the patient's history were reviewed and updated as appropriate: allergies, current medications, past family history, past medical history, past social history, past surgical history and problem list. Past Medical History:  Diagnosis Date  . Depression   . Psoriasis     Past Surgical History:  Procedure Laterality Date  . HYSTEROSCOPY W/D&C  12/03/2011   Procedure: DILATATION AND CURETTAGE /HYSTEROSCOPY;  Surgeon: Jonnie Kind, MD;  Location: [facility];  Service: Gynecology;  Laterality: N/A;  Hysteroscopy with excision of prolapsed cervical fibroid, done at 1342  . LAPAROSCOPIC TUBAL LIGATION  12/03/2011   Procedure: LAPAROSCOPIC TUBAL LIGATION;  Surgeon: Jonnie Kind, MD;  Location: [facility];  Service: Gynecology;  Laterality: N/A;  started at 1346  . LEEP  1995  . TUBAL LIGATION       Current Outpatient Prescriptions:  .  ALPRAZolam (XANAX) 0.5 MG tablet, Take 1/2 - 1 tablet once daily for anxiety (Patient taking differently: Take 1/2 - 1 tablet prn for anxiety), Disp: 30 tablet, Rfl: 1 .  citalopram (CELEXA) 40 MG tablet, Take 1 tablet (40 mg total) by mouth daily., Disp: 30 tablet, Rfl: 12 .  HUMIRA PEN-PSORIASIS STARTER 40 MG/0.8ML PNKT, once a week. , Disp: , Rfl:   Review of  Systems Constitutional: negative Gastrointestinal: negative Genitourinary: negative  Objective:  BP 102/60 (BP Location: Left Arm, Patient Position: Sitting, Cuff Size: Normal)   Pulse (!) 50   Ht 5' 6.5" (1.689 m)   Wt 145 lb 8 oz (66 kg)   LMP 07/31/2016 (Exact Date)   BMI 23.13 kg/m    BMI: Body mass index is 23.13 kg/m.  General Appearance: Alert, appropriate appearance for age. No acute distress HEENT: Grossly normal Neck / Thyroid:  Cardiovascular: RRR; normal S1, S2, no murmur Lungs: CTA bilaterally Back: No CVAT Breast Exam: No dimpling, nipple retraction or discharge. No masses or nodes., Normal to inspection, Normal breast tissue bilaterally Gastrointestinal: Soft, non-tender, no masses or organomegaly Pelvic Exam: Pelvic -  VULVA: normal appearing vulva with no masses, tenderness or lesions,  VAGINA: normal appearing vagina with normal color and discharge, no lesions,  CERVIX: normal appearing cervix without discharge or lesions,  UTERUS: uterus tilts back, retroverted., normal ssc, no dyspareunia  ADNEXA: normal adnexa in size, nontender and no masses Pap done Rectovaginal: guaiac negative stool obtained Lymphatic Exam: Non-palpable nodes in neck, clavicular, axillary, or inguinal regions Skin: no rash or abnormalities Neurologic: Normal gait and speech, no tremor  Psychiatric: Alert and oriented, appropriate affect.  Urinalysis:Not done  Mallory Shirk. MD Pgr [phone] 9:45 AM   By signing my name below, I, Evelene Croon, attest that this documentation has been prepared under the direction and in the presence of Jonnie Kind, MD . Electronically Signed: Beverlee Nims  Omoyeni, Education administrator. 08/27/2016. 9:45 AM. I personally performed the services described in this documentation, which was SCRIBED in my presence. The recorded information has been reviewed and considered accurate. It has been edited as necessary during review. Jonnie Kind, MD

## 2016-08-27 NOTE — Addendum Note (Signed)
Addended by: Octaviano Glow on: 08/27/2016 11:18 AM   Modules accepted: Orders

## 2016-08-28 LAB — CYTOLOGY - PAP
DIAGNOSIS: NEGATIVE
HPV: NOT DETECTED

## 2017-07-02 ENCOUNTER — Other Ambulatory Visit: Payer: Self-pay | Admitting: Obstetrics and Gynecology

## 2017-07-02 DIAGNOSIS — Z1231 Encounter for screening mammogram for malignant neoplasm of breast: Secondary | ICD-10-CM

## 2017-07-24 ENCOUNTER — Encounter: Payer: Self-pay | Admitting: Obstetrics and Gynecology

## 2017-07-24 ENCOUNTER — Other Ambulatory Visit: Payer: Self-pay

## 2017-07-24 ENCOUNTER — Ambulatory Visit: Payer: BC Managed Care – PPO | Admitting: Obstetrics and Gynecology

## 2017-07-24 VITALS — BP 110/66 | HR 56 | Ht 66.5 in | Wt 149.0 lb

## 2017-07-24 DIAGNOSIS — F329 Major depressive disorder, single episode, unspecified: Secondary | ICD-10-CM | POA: Diagnosis not present

## 2017-07-24 MED ORDER — CITALOPRAM HYDROBROMIDE 40 MG PO TABS: 40.0000 mg | ORAL_TABLET | Freq: Every day | ORAL | 3 refills | Status: DC

## 2017-07-24 NOTE — Progress Notes (Signed)
Patient ID: Kristin Chang, female   DOB: [birth year], 42 y.o.   MRN: [id number]   [facility] Visit  @DATE @            Patient name: Kristin Chang MRN [id number]  Date of birth: 1975-10-26  CC & HPI:  Kristin Chang is a 42 y.o. female presenting today for medication refill of her Celexa.  40 mg /d. She is doing well overall. No changes noted. She denies fever, chills or any other symptoms or complaints at this time.   ROS:  ROS -fever -chills All systems are negative except as noted in the HPI and PMH.  Daughter is swimmer Pertinent History Reviewed:   Reviewed: Significant for Depression Medical         Past Medical History:  Diagnosis Date  . Depression   . Psoriasis                               Surgical Hx:    Past Surgical History:  Procedure Laterality Date  . HYSTEROSCOPY W/D&C  12/03/2011   Procedure: DILATATION AND CURETTAGE /HYSTEROSCOPY;  Surgeon: Jonnie Kind, MD;  Location: [facility];  Service: Gynecology;  Laterality: N/A;  Hysteroscopy with excision of prolapsed cervical fibroid, done at 1342  . LAPAROSCOPIC TUBAL LIGATION  12/03/2011   Procedure: LAPAROSCOPIC TUBAL LIGATION;  Surgeon: Jonnie Kind, MD;  Location: [facility];  Service: Gynecology;  Laterality: N/A;  started at 1346  . LEEP  1995  . TUBAL LIGATION     Medications: Reviewed & Updated - see associated section                       Current Outpatient Medications:  .  citalopram (CELEXA) 40 MG tablet, Take 1 tablet (40 mg total) by mouth daily., Disp: 30 tablet, Rfl: 12 .  Guselkumab (TREMFYA) 100 MG/ML SOSY, Inject 100 mg into the skin., Disp: , Rfl:  .  ALPRAZolam (XANAX) 0.5 MG tablet, Take 1/2 - 1 tablet once daily for anxiety (Patient not taking: Reported on 07/24/2017), Disp: 30 tablet, Rfl: 1   Social History: Reviewed -  reports that she has quit smoking. Her smoking use included cigarettes. She quit after 4.00 years of use. She has never used smokeless tobacco.  Objective  Findings:  Vitals: Blood pressure 110/66, pulse (!) 56, height 5' 6.5" (1.689 m), weight 149 lb (67.6 kg), last menstrual period 06/27/2017.  PHYSICAL EXAMINATION General appearance - alert, well appearing, and in no distress, oriented to person, place, and time and normal appearing weight Mental status - alert, oriented to person, place, and time, normal mood, behavior, speech, dress, motor activity, and thought processes, affect appropriate to mood  PELVIC DEFERRED   Assessment & Plan:   A:  1. Hx of Depression, stable  P:  1. Refill Rx Celexa 2. F/u 1 year   By signing my name below, I, Margit Banda, attest that this documentation has been prepared under the direction and in the presence of Jonnie Kind, MD. Electronically Signed: Margit Banda, Medical Scribe. 07/24/17. 12:42 PM.  I personally performed the services described in this documentation, which was SCRIBED in my presence. The recorded information has been reviewed and considered accurate. It has been edited as necessary during review. Jonnie Kind, MD

## 2017-07-26 ENCOUNTER — Ambulatory Visit
Admission: RE | Admit: 2017-07-26 | Discharge: 2017-07-26 | Disposition: A | Discharge status: Home / Self Care | Payer: BC Managed Care – PPO | Source: Ambulatory Visit | Attending: Obstetrics and Gynecology | Admitting: Obstetrics and Gynecology

## 2017-07-26 DIAGNOSIS — Z1231 Encounter for screening mammogram for malignant neoplasm of breast: Secondary | ICD-10-CM

## 2018-06-24 ENCOUNTER — Other Ambulatory Visit: Payer: Self-pay | Admitting: Obstetrics and Gynecology

## 2018-06-24 DIAGNOSIS — Z1231 Encounter for screening mammogram for malignant neoplasm of breast: Secondary | ICD-10-CM

## 2018-08-04 ENCOUNTER — Telehealth: Payer: Self-pay | Admitting: Obstetrics and Gynecology

## 2018-08-04 NOTE — Telephone Encounter (Signed)
Patient called, she needs a refill on Celexa.   [facility]   [phone]

## 2018-08-05 ENCOUNTER — Other Ambulatory Visit: Payer: Self-pay | Admitting: Obstetrics and Gynecology

## 2018-08-05 ENCOUNTER — Encounter: Payer: Self-pay | Admitting: *Deleted

## 2018-08-05 MED ORDER — CITALOPRAM HYDROBROMIDE 40 MG PO TABS
40.0000 mg | ORAL_TABLET | Freq: Every day | ORAL | 3 refills | Status: DC
Start: 2018-08-05 — End: 2019-08-04

## 2018-08-05 NOTE — Telephone Encounter (Signed)
I will refil x 3 months, and ask for pt to get a f/u telephone visit,

## 2018-08-05 NOTE — Progress Notes (Signed)
refil for citalopram 40 mg daily sent in.  Pt has been on since at least 2014.

## 2018-08-19 ENCOUNTER — Other Ambulatory Visit: Payer: Self-pay

## 2018-08-19 ENCOUNTER — Ambulatory Visit
Admission: RE | Admit: 2018-08-19 | Discharge: 2018-08-19 | Disposition: A | Discharge status: Home / Self Care | Payer: BC Managed Care – PPO | Source: Ambulatory Visit | Attending: Obstetrics and Gynecology | Admitting: Obstetrics and Gynecology

## 2018-08-19 DIAGNOSIS — Z1231 Encounter for screening mammogram for malignant neoplasm of breast: Secondary | ICD-10-CM

## 2019-03-31 ENCOUNTER — Other Ambulatory Visit: Payer: Self-pay | Admitting: Physician Assistant

## 2019-07-21 ENCOUNTER — Other Ambulatory Visit: Payer: Self-pay | Admitting: Obstetrics and Gynecology

## 2019-07-21 DIAGNOSIS — Z1231 Encounter for screening mammogram for malignant neoplasm of breast: Secondary | ICD-10-CM

## 2019-08-04 ENCOUNTER — Telehealth: Payer: Self-pay | Admitting: Obstetrics and Gynecology

## 2019-08-04 ENCOUNTER — Other Ambulatory Visit: Payer: Self-pay | Admitting: *Deleted

## 2019-08-04 NOTE — Telephone Encounter (Signed)
Refill request sent to Dr. Glo Herring.

## 2019-08-04 NOTE — Telephone Encounter (Signed)
Pt would like refill sent to pharmacy for medication celexa patient will run out before scheduled physical that's in July

## 2019-08-05 MED ORDER — CITALOPRAM HYDROBROMIDE 40 MG PO TABS: 40.0000 mg | ORAL_TABLET | Freq: Every day | ORAL | 0 refills | Status: DC

## 2019-08-05 NOTE — Telephone Encounter (Signed)
rx citalopram 40 mg x 90 tabs

## 2019-08-20 ENCOUNTER — Ambulatory Visit: Payer: BC Managed Care – PPO

## 2019-09-02 ENCOUNTER — Ambulatory Visit
Admission: RE | Admit: 2019-09-02 | Discharge: 2019-09-02 | Disposition: A | Discharge status: Home / Self Care | Payer: BC Managed Care – PPO | Source: Ambulatory Visit | Attending: Obstetrics and Gynecology | Admitting: Obstetrics and Gynecology

## 2019-09-02 ENCOUNTER — Other Ambulatory Visit: Payer: Self-pay

## 2019-09-02 DIAGNOSIS — Z1231 Encounter for screening mammogram for malignant neoplasm of breast: Secondary | ICD-10-CM

## 2019-09-09 ENCOUNTER — Other Ambulatory Visit (HOSPITAL_COMMUNITY)
Admission: RE | Admit: 2019-09-09 | Discharge: 2019-09-09 | Disposition: A | Discharge status: Home / Self Care | Payer: BC Managed Care – PPO | Source: Ambulatory Visit | Attending: Obstetrics and Gynecology | Admitting: Obstetrics and Gynecology

## 2019-09-09 ENCOUNTER — Encounter: Payer: Self-pay | Admitting: Obstetrics and Gynecology

## 2019-09-09 ENCOUNTER — Ambulatory Visit (INDEPENDENT_AMBULATORY_CARE_PROVIDER_SITE_OTHER): Payer: BC Managed Care – PPO | Admitting: Obstetrics and Gynecology

## 2019-09-09 VITALS — BP 100/67 | HR 56 | Ht 67.0 in | Wt 143.2 lb

## 2019-09-09 DIAGNOSIS — Z01419 Encounter for gynecological examination (general) (routine) without abnormal findings: Secondary | ICD-10-CM

## 2019-09-09 NOTE — Patient Instructions (Addendum)
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 Please share it with them.  Stay blessed.  Pap results in 10 days.  In [place].

## 2019-09-09 NOTE — Progress Notes (Signed)
**Note Kristin-Identified via Obfuscation** Patient ID: Kristin Chang, female   DOB: [birth year], 44 y.o.   MRN: [id number]   Assessment:  Annual Gyn Exam Plan:  1. Pap smear done, next pap due in 5 years 2. Return annually or prn 3    Annual mammogram advised after age 21 Subjective:  Kristin Chang is a 44 y.o. female G1P0101 who presents for annual exam. Patient's last menstrual period was 08/29/2019 (exact date). The patient has no complaints today. She notes that she gained some weight last year but was able to lose most of it. Her periods usually last 1-2 days so she thinks she is going through menopause. She is sleeping well and denies abdominal pain.  The patient gets regular screening mammograms. Most recent mammogram on 09/02/2019 was negative for malignancy.   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 She stays active and relieves stress by exercising almost everyday. Her daughter is going to college this fall and their [place].  The following portions of the patient's history were reviewed and updated as appropriate: allergies, current medications, past family history, past medical history, past social history, past surgical history and problem list. Past Medical History:  Diagnosis Date   Depression    Psoriasis     Past Surgical History:  Procedure Laterality Date   HYSTEROSCOPY WITH D & C  12/03/2011   Procedure: DILATATION AND CURETTAGE /HYSTEROSCOPY;  Surgeon: Jonnie Kind, MD;  Location: [facility];  Service: Gynecology;  Laterality: N/A;  Hysteroscopy with excision of prolapsed cervical fibroid, done at [facility]  12/03/2011   Procedure: LAPAROSCOPIC TUBAL LIGATION;  Surgeon: Jonnie Kind, MD;  Location: [facility];  Service: Gynecology;  Laterality: N/A;  started at [place]       Current Outpatient Medications:    citalopram (CELEXA) 40 MG tablet, Take 1 tablet  (40 mg total) by mouth daily., Disp: 90 tablet, Rfl: 0   Guselkumab (TREMFYA) 100 MG/ML SOSY, Inject 100 mg into the skin., Disp: , Rfl:   Review of Systems Constitutional: negative Gastrointestinal: negative Genitourinary: negative  Objective:  BP 100/67 (BP Location: Right Arm, Patient Position: Sitting, Cuff Size: Normal)    Pulse (!) 56    Ht 5\' 7"  (1.702 m)    Wt 143 lb 3.2 oz (65 kg)    LMP 08/29/2019 (Exact Date)    BMI 22.43 kg/m    BMI: Body mass index is 22.43 kg/m.  General Appearance: Alert, appropriate appearance for age. No acute distress HEENT: Grossly normal Neck / Thyroid:  Cardiovascular: RRR; normal S1, S2, no murmur Lungs: CTA bilaterally Back: No CVAT Breast Exam: Normal to inspection, Normal breast tissue bilaterally and No masses or nodes.No dimpling, nipple retraction or discharge. Gastrointestinal: Soft, non-tender, no masses or organomegaly Pelvic Exam: Cervix: clear mucous Uterus: tilts posteriorly, fibroids.8 wk size, denies dyspareunia Rectovaginal: not indicated and guaiac negative stool obtained Lymphatic Exam: Non-palpable nodes in neck, clavicular, axillary, or inguinal regions  Skin: no rash or abnormalities Neurologic: Normal gait and speech, no tremor  Psychiatric: Alert and oriented, appropriate affect.  Urinalysis:Not done  Mallory Shirk. MD Pgr [phone] 10:11 AM  By signing my name below, I, Kristin Chang, attest that this documentation has been prepared under the direction and in the presence of Jonnie Kind, MD. Electronically Signed: De Chang, Medical Scribe. 09/09/19. 10:11 AM.  I personally performed the services described in this documentation, which was SCRIBED in my presence. The recorded information has been reviewed and considered accurate. It has been edited as necessary during review. Jonnie Kind, MD

## 2019-09-11 LAB — CYTOLOGY - PAP
Comment: NEGATIVE
Diagnosis: NEGATIVE
High risk HPV: NEGATIVE

## 2019-10-13 ENCOUNTER — Ambulatory Visit: Payer: BC Managed Care – PPO | Admitting: Physician Assistant

## 2019-11-27 ENCOUNTER — Other Ambulatory Visit: Payer: Self-pay | Admitting: Obstetrics and Gynecology

## 2019-11-27 DEATH — deceased

## 2019-12-01 ENCOUNTER — Other Ambulatory Visit: Payer: Self-pay | Admitting: *Deleted

## 2019-12-01 MED ORDER — CITALOPRAM HYDROBROMIDE 40 MG PO TABS: 40.0000 mg | ORAL_TABLET | Freq: Every day | ORAL | 3 refills | Status: DC

## 2019-12-15 ENCOUNTER — Encounter: Payer: Self-pay | Admitting: Dermatology

## 2019-12-15 ENCOUNTER — Ambulatory Visit: Payer: BC Managed Care – PPO | Admitting: Dermatology

## 2019-12-15 ENCOUNTER — Other Ambulatory Visit: Payer: Self-pay

## 2019-12-15 DIAGNOSIS — Z85828 Personal history of other malignant neoplasm of skin: Secondary | ICD-10-CM | POA: Diagnosis not present

## 2019-12-15 DIAGNOSIS — Z86018 Personal history of other benign neoplasm: Secondary | ICD-10-CM | POA: Diagnosis not present

## 2019-12-15 DIAGNOSIS — Z8589 Personal history of malignant neoplasm of other organs and systems: Secondary | ICD-10-CM

## 2019-12-15 DIAGNOSIS — Z1283 Encounter for screening for malignant neoplasm of skin: Secondary | ICD-10-CM | POA: Diagnosis not present

## 2019-12-15 DIAGNOSIS — D485 Neoplasm of uncertain behavior of skin: Secondary | ICD-10-CM

## 2019-12-15 NOTE — Patient Instructions (Signed)

## 2019-12-17 NOTE — Progress Notes (Signed)
° °  Follow-Up Visit   Subjective  Kristin Chang is a 44 y.o. female who presents for the following: Follow-up (Patient here today for 6 month follow up for Left Upper Inner Scapula that was bx on 03/31/2019. Patient has a few places on her chest that she would like looked at x 1 month, patient states that the areas do bleed sometimes.).  Follow up Location:  Duration:  Quality:  Associated Signs/Symptoms: Modifying Factors:  Severity:  Timing: Context:   Objective  Well appearing patient in no apparent distress; mood and affect are within normal limits.  All skin waist up examined.   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  Annual skin examination  Encounter for screening for malignant neoplasm of skin Left Breast  Yearly skin examination  Neoplasm of uncertain behavior of skin Right Breast  Skin / nail biopsy Type of biopsy: tangential   Informed consent: discussed and consent obtained   Timeout: patient name, date of birth, surgical site, and procedure verified   Procedure prep:  Patient was prepped and draped in usual sterile fashion (Non sterile) Prep type:  Chlorhexidine Anesthesia: the lesion was anesthetized in a standard fashion   Anesthetic:  1% lidocaine w/ epinephrine 1-100,000 local infiltration Instrument used: flexible razor blade   Outcome: patient tolerated procedure well   Post-procedure details: wound care instructions given    Specimen 1 - Surgical pathology Differential Diagnosis: r.o atypia Check Margins:  No     I, Lavonna Monarch, MD, have reviewed all documentation for this visit.  The documentation on 01/23/20 for the exam, diagnosis, procedures, and orders are all accurate and complete.

## 2020-01-23 ENCOUNTER — Encounter: Payer: Self-pay | Admitting: Dermatology

## 2020-03-02 ENCOUNTER — Other Ambulatory Visit: Payer: Self-pay

## 2020-03-02 MED ORDER — TREMFYA 100 MG/ML ~~LOC~~ SOSY: 100.0000 mg | PREFILLED_SYRINGE | SUBCUTANEOUS | 4 refills | Status: DC

## 2020-03-06 ENCOUNTER — Other Ambulatory Visit: Payer: Self-pay | Admitting: Physician Assistant

## 2020-05-24 ENCOUNTER — Encounter: Payer: Self-pay | Admitting: Dermatology

## 2020-05-24 ENCOUNTER — Ambulatory Visit (INDEPENDENT_AMBULATORY_CARE_PROVIDER_SITE_OTHER): Payer: BC Managed Care – PPO | Admitting: Dermatology

## 2020-05-24 ENCOUNTER — Other Ambulatory Visit: Payer: Self-pay

## 2020-05-24 DIAGNOSIS — L409 Psoriasis, unspecified: Secondary | ICD-10-CM

## 2020-05-24 DIAGNOSIS — Z79899 Other long term (current) drug therapy: Secondary | ICD-10-CM

## 2020-05-28 LAB — QUANTIFERON-TB GOLD PLUS
Mitogen-NIL: >10 IU/mL
NIL: 0.02 IU/mL
QuantiFERON-TB Gold Plus: NEGATIVE
TB1-NIL: 0 IU/mL
TB2-NIL: 0.01 IU/mL

## 2020-05-30 ENCOUNTER — Telehealth: Payer: Self-pay | Admitting: Dermatology

## 2020-05-30 MED ORDER — TREMFYA 100 MG/ML ~~LOC~~ SOSY: 100.0000 mg | PREFILLED_SYRINGE | SUBCUTANEOUS | 4 refills | Status: DC

## 2020-05-30 NOTE — Telephone Encounter (Signed)
Patient is calling to see if we have gotten her lab results (TB Test in particular) so that she can get her medication.

## 2020-05-30 NOTE — Telephone Encounter (Signed)
Phone call to patient to inform her that we have received her TB results and that we will get her Tremfya refilled for her.  Voicemail left with this information.

## 2020-06-02 ENCOUNTER — Telehealth: Payer: Self-pay | Admitting: Dermatology

## 2020-06-02 ENCOUNTER — Encounter: Payer: Self-pay | Admitting: Dermatology

## 2020-06-02 ENCOUNTER — Telehealth: Payer: Self-pay | Admitting: Physician Assistant

## 2020-06-02 NOTE — Telephone Encounter (Signed)
Patient aware she never updated [place] with the new 2022 insurance card. PA denied due to wrong insurance in system, new number given over the phone  [id number]  RX BIN [id number]  Carlton Adam (Key: [id number])  Your information has been submitted to [facility]. To check for an updated outcome later, reopen this PA request from your dashboard.  If [facility] has not responded to your request within 24 hours, contact [place] at [phone]. If you think there may be a problem with your PA request, use our live chat feature at the bottom right.

## 2020-06-02 NOTE — Telephone Encounter (Signed)
[facility] Prior Authorization Department left message on office voice mail saying that they needed more information in order to complete the prior authorization request.  The reference PA # is [id number].  The telephone number is [phone].

## 2020-06-02 NOTE — Telephone Encounter (Signed)
[facility] called about starting a prior auth. ST patient, not KRS [phone]

## 2020-06-02 NOTE — Progress Notes (Signed)
   Follow-Up Visit   Subjective  Kristin Chang is a 45 y.o. female who presents for the following: Psoriasis (Follow up - completely  clear tx- tremfya).  Psoriasis Location:  Duration:  Quality: Generally clear Associated Signs/Symptoms: Modifying Factors: Tremfya Severity:  Timing: Context:   Objective  Well appearing patient in no apparent distress; mood and affect are within normal limits. Objective  Left Shoulder - Anterior: Patient states that she is completely clear.  Denies injection site reactions.  No current joint symptoms.    A focused examination was performed including face, neck, chest and back. Relevant physical exam findings are noted in the Assessment and Plan.   Assessment & Plan    Psoriasis Left Shoulder - Anterior  Continue tremfya.  Reviewed the need for annual tuberculosis test.  To contact us with any issues related to this therapy or if her psoriasis flares.  Follow-up 1 year.  Other Related Procedures QuantiFERON-TB Gold Plus  Encounter for long-term (current) use of medications  Other Related Procedures QuantiFERON-TB Gold Plus      I, Lavonna Monarch, MD, have reviewed all documentation for this visit.  The documentation on 06/02/20 for the exam, diagnosis, procedures, and orders are all accurate and complete.

## 2020-06-04 NOTE — Progress Notes (Signed)
   Follow-Up Visit   Subjective  Kristin Chang is a 45 y.o. female who presents for the following: Psoriasis (Follow up - completely  clear tx- tremfya).  Psoriasis Location:  Duration:  Quality: Clear Associated Signs/Symptoms: Modifying Factors: Tremfya Severity:  Timing: Context:   Objective  Well appearing patient in no apparent distress; mood and affect are within normal limits. Objective  Left Shoulder - Anterior: Patient states that she is completely clear.  Denies injection site reactions.  No current joint symptoms.    A focused examination was performed including Scalp, face, back, arms, nails.. Relevant physical exam findings are noted in the Assessment and Plan.   Assessment & Plan    Psoriasis Left Shoulder - Anterior  Continue tremfya.  Reviewed the need for annual tuberculosis test.  To contact us with any issues related to this therapy or if her psoriasis flares.  Follow-up 1 year.  Other Related Procedures QuantiFERON-TB Gold Plus  Encounter for long-term (current) use of medications  Other Related Procedures QuantiFERON-TB Gold Plus      I, Lavonna Monarch, MD, have reviewed all documentation for this visit.  The documentation on 06/04/20 for the exam, diagnosis, procedures, and orders are all accurate and complete.

## 2020-06-07 ENCOUNTER — Telehealth: Payer: Self-pay | Admitting: Dermatology

## 2020-06-07 NOTE — Telephone Encounter (Signed)
Wants to know status of Rx that was held up because of insurance issue

## 2020-06-08 NOTE — Telephone Encounter (Signed)
Patient left message on office voice mail saying that she was checking on status of her Tremfiya prescription.

## 2020-06-09 NOTE — Telephone Encounter (Signed)
Patient called back and I explained to her that we are still waiting on the prior authorization.  Patient states she was 3 weeks overdue on her injection- I offered her the sample that the drug rep gave [place] but she states she can't come get it .. she's in [place], said she may call next week if its till available.

## 2020-06-09 NOTE — Telephone Encounter (Signed)
Left message for patient to return our phone call. Called [facility] to see where we were in the process of prior authorization for Tremfya.  Per Elberta Fortis as [facility]- tremfya denied due to lack of clinical documentation. Informed him that we faxed those documents on 06-02-20 and he claims they never got them.  I refaxed the notes to shervonne sherrod at [phone].  PA # [id number]

## 2020-06-09 NOTE — Telephone Encounter (Signed)
Patient calling again because she has not heard back from [place] yet. I informed her that we are short staffed and someone will call her with an update. She states that she got a letter in the mail stating that PA was denied. After further research I informed patient that was likely a letter in regards to the first PA attempt with the outdated insurance information. Patient voiced understanding and is waiting for clinical staff to call her.

## 2020-06-14 ENCOUNTER — Telehealth: Payer: Self-pay | Admitting: *Deleted

## 2020-06-14 NOTE — Telephone Encounter (Signed)
Prior authorization for Tremyfa approved 06/09/2020- 06/09/2021 PA # [id number]

## 2020-09-28 ENCOUNTER — Encounter: Payer: Self-pay | Admitting: Adult Health

## 2020-09-28 ENCOUNTER — Ambulatory Visit (INDEPENDENT_AMBULATORY_CARE_PROVIDER_SITE_OTHER): Payer: BC Managed Care – PPO | Admitting: Adult Health

## 2020-09-28 ENCOUNTER — Other Ambulatory Visit: Payer: Self-pay

## 2020-09-28 VITALS — BP 104/58 | HR 52 | Ht 67.0 in | Wt 147.0 lb

## 2020-09-28 DIAGNOSIS — N951 Menopausal and female climacteric states: Secondary | ICD-10-CM

## 2020-09-28 DIAGNOSIS — Z1231 Encounter for screening mammogram for malignant neoplasm of breast: Secondary | ICD-10-CM | POA: Diagnosis not present

## 2020-09-28 DIAGNOSIS — Z01419 Encounter for gynecological examination (general) (routine) without abnormal findings: Secondary | ICD-10-CM

## 2020-09-28 DIAGNOSIS — Z1212 Encounter for screening for malignant neoplasm of rectum: Secondary | ICD-10-CM

## 2020-09-28 DIAGNOSIS — Z1211 Encounter for screening for malignant neoplasm of colon: Secondary | ICD-10-CM | POA: Diagnosis not present

## 2020-09-28 LAB — HEMOCCULT GUIAC POC 1CARD (OFFICE): Fecal Occult Blood, POC: NEGATIVE

## 2020-09-28 NOTE — Patient Instructions (Signed)
Thank you for choosing our office today! We appreciate the opportunity to meet your healthcare needs. You may receive a short survey by e-mail or through MyChart. If you are happy with your care we would appreciate if you could take just a few minutes to complete the survey questions. We read all of your comments and take your feedback very seriously. Thank you again for choosing our office.  Tayra Dawe   [facility] Team  

## 2020-09-28 NOTE — Progress Notes (Signed)
Patient ID: Kristin Chang, female   DOB: [birth year], 45 y.o.   MRN: [id number] History of Present Illness: Kristin Chang is a 45 year old white female,married, G1P0101, in for a well woman gyn exam and requests labs and she needs mammogram.  Lab Results  Component Value Date   DIAGPAP  09/09/2019    - Negative for intraepithelial lesion or malignancy (NILM)   HPV NOT DETECTED 08/27/2016   [place] Negative 09/09/2019   PCP is Dr Hilma Favors.  Current Medications, Allergies, Past Medical History, Past Surgical History, Family History and Social History were reviewed in [employer] record.     Review of Systems: Patient denies any headaches, hearing loss, fatigue, blurred vision, shortness of breath, chest pain, abdominal pain, problems with bowel movements, urination, or intercourse. No joint pain or mood swings.  Periods short and light.   Physical Exam:BP (!) 104/58 (BP Location: Left Arm, Patient Position: Sitting, Cuff Size: Normal)   Pulse (!) 52   Ht '5\' 7"'$  (1.702 m)   Wt 147 lb (66.7 kg)   LMP 09/09/2020   BMI 23.02 kg/m   General:  Well developed, well nourished, no acute distress Skin:  Warm and dry Neck:  Midline trachea, normal thyroid, good ROM, no lymphadenopathy Lungs; Clear to auscultation bilaterally Breast:  No dominant palpable mass, retraction, or nipple discharge Cardiovascular: Regular rate and rhythm Abdomen:  Soft, non tender, no hepatosplenomegaly Pelvic:  External genitalia is normal in appearance, no lesions.  The vagina is normal in appearance. Urethra has no lesions or masses. The cervix is bulbous.  Uterus is felt to be normal size, shape, and contour.  No adnexal masses or tenderness noted.Bladder is non tender, no masses felt. Rectal: Good sphincter tone, no polyps, or hemorrhoids felt.  Hemoccult negative.Mild low rectocele. Extremities/musculoskeletal:  No swelling or varicosities noted, no clubbing or cyanosis Psych:  No mood  changes, alert and cooperative,seems happy AA is 3 Fall risk is low Depression screen [facility] 2/9 09/28/2020 09/09/2019 07/24/2017  Decreased Interest 0 0 0  Down, Depressed, Hopeless 0 0 0  PHQ - 2 Score 0 0 0  Altered sleeping 0 0 0  Tired, decreased energy 1 0 0  Change in appetite 0 0 0  Feeling bad or failure about yourself  0 0 0  Trouble concentrating 0 0 0  Moving slowly or fidgety/restless 0 0 0  Suicidal thoughts 0 0 0  PHQ-9 Score 1 0 0  Difficult doing work/chores - Not difficult at all -   On celexa GAD 7 : Generalized Anxiety Score 09/28/2020 09/09/2019  Nervous, Anxious, on Edge 0 0  Control/stop worrying 0 0  Worry too much - different things 0 0  Trouble relaxing 0 0  Restless 0 0  Easily annoyed or irritable 1 0  Afraid - awful might happen 0 0  Total GAD 7 Score 1 0  Anxiety Difficulty - Not difficult at all    Upstream - 09/28/20 1139       Pregnancy Intention Screening   Does the patient want to become pregnant in the next year? No    Does the patient's partner want to become pregnant in the next year? No    Would the patient like to discuss contraceptive options today? No      Contraception Wrap Up   Current Method Female Sterilization   tubal   End Method Female Sterilization   tubal   Contraception Counseling Provided No  Examination chaperoned by Celene Squibb LPN     Impression and Plan: 1. Encounter for well woman exam with routine gynecological exam Will check labs Physical in 1 year Pap 2024 - CBC - Comprehensive metabolic panel - TSH - Lipid panel  2. Encounter for screening fecal occult blood testing - POCT occult blood stool  3. Peri-menopause Review handout   4. Screening mammogram for breast cancer PT to call and schedule at the [facility] in [place]; Future  5. Screening for colorectal cancer Referred to [facility] for colonoscopy  - Ambulatory referral to Gastroenterology

## 2020-09-29 LAB — COMPREHENSIVE METABOLIC PANEL
ALT: 9 IU/L (ref 0–32)
AST: 20 IU/L (ref 0–40)
Albumin/Globulin Ratio: 1.5 (ref 1.2–2.2)
Albumin: 4.5 g/dL (ref 3.8–4.8)
Alkaline Phosphatase: 40 IU/L — ABNORMAL LOW (ref 44–121)
BUN/Creatinine Ratio: 19 (ref 9–23)
BUN: 13 mg/dL (ref 6–24)
Bilirubin Total: 0.8 mg/dL (ref 0.0–1.2)
CO2: 22 mmol/L (ref 20–29)
Calcium: 9.7 mg/dL (ref 8.7–10.2)
Chloride: 105 mmol/L (ref 96–106)
Creatinine, Ser: 0.7 mg/dL (ref 0.57–1.00)
Globulin, Total: 3.1 g/dL (ref 1.5–4.5)
Glucose: 86 mg/dL (ref 65–99)
Potassium: 5.7 mmol/L — ABNORMAL HIGH (ref 3.5–5.2)
Sodium: 141 mmol/L (ref 134–144)
Total Protein: 7.6 g/dL (ref 6.0–8.5)
eGFR: 107 mL/min/{1.73_m2} (ref >59)

## 2020-09-29 LAB — CBC
Hematocrit: 40 % (ref 34.0–46.6)
Hemoglobin: 12.9 g/dL (ref 11.1–15.9)
MCH: 29.3 pg (ref 26.6–33.0)
MCHC: 32.3 g/dL (ref 31.5–35.7)
MCV: 91 fL (ref 79–97)
Platelets: 311 10*3/uL (ref 150–450)
RBC: 4.41 x10E6/uL (ref 3.77–5.28)
RDW: 12.2 % (ref 11.7–15.4)
WBC: 5.7 10*3/uL (ref 3.4–10.8)

## 2020-09-29 LAB — LIPID PANEL
Chol/HDL Ratio: 2 ratio (ref 0.0–4.4)
Cholesterol, Total: 181 mg/dL (ref 100–199)
HDL: 92 mg/dL (ref >39)
LDL Chol Calc (NIH): 78 mg/dL (ref 0–99)
Triglycerides: 55 mg/dL (ref 0–149)
VLDL Cholesterol Cal: 11 mg/dL (ref 5–40)

## 2020-09-29 LAB — TSH: TSH: 1.81 u[IU]/mL (ref 0.450–4.500)

## 2020-10-03 ENCOUNTER — Other Ambulatory Visit: Payer: Self-pay | Admitting: Adult Health

## 2020-10-03 DIAGNOSIS — E875 Hyperkalemia: Secondary | ICD-10-CM

## 2020-10-03 NOTE — Progress Notes (Signed)
Recheck CMP 

## 2020-10-04 ENCOUNTER — Encounter: Payer: Self-pay | Admitting: Internal Medicine

## 2020-10-11 LAB — COMPREHENSIVE METABOLIC PANEL
ALT: 10 IU/L (ref 0–32)
AST: 18 IU/L (ref 0–40)
Albumin/Globulin Ratio: 1.7 (ref 1.2–2.2)
Albumin: 4.2 g/dL (ref 3.8–4.8)
Alkaline Phosphatase: 40 IU/L — ABNORMAL LOW (ref 44–121)
BUN/Creatinine Ratio: 18 (ref 9–23)
BUN: 14 mg/dL (ref 6–24)
Bilirubin Total: 0.5 mg/dL (ref 0.0–1.2)
CO2: 23 mmol/L (ref 20–29)
Calcium: 8.9 mg/dL (ref 8.7–10.2)
Chloride: 104 mmol/L (ref 96–106)
Creatinine, Ser: 0.76 mg/dL (ref 0.57–1.00)
Globulin, Total: 2.5 g/dL (ref 1.5–4.5)
Glucose: 97 mg/dL (ref 65–99)
Potassium: 4.5 mmol/L (ref 3.5–5.2)
Sodium: 139 mmol/L (ref 134–144)
Total Protein: 6.7 g/dL (ref 6.0–8.5)
eGFR: 97 mL/min/{1.73_m2} (ref >59)

## 2020-11-08 ENCOUNTER — Ambulatory Visit: Payer: BC Managed Care – PPO

## 2020-11-09 ENCOUNTER — Telehealth: Payer: Self-pay | Admitting: Internal Medicine

## 2020-11-09 ENCOUNTER — Other Ambulatory Visit: Payer: Self-pay

## 2020-11-09 ENCOUNTER — Ambulatory Visit (INDEPENDENT_AMBULATORY_CARE_PROVIDER_SITE_OTHER): Payer: Self-pay | Admitting: *Deleted

## 2020-11-09 VITALS — Ht 67.0 in | Wt 145.0 lb

## 2020-11-09 DIAGNOSIS — Z1211 Encounter for screening for malignant neoplasm of colon: Secondary | ICD-10-CM

## 2020-11-09 MED ORDER — NA SULFATE-K SULFATE-MG SULF 17.5-3.13-1.6 GM/177ML PO SOLN: 1.0000 | Freq: Once | ORAL | 0 refills | Status: AC

## 2020-11-09 NOTE — Telephone Encounter (Signed)
Pt requested phone visit today at 4:15.

## 2020-11-09 NOTE — Progress Notes (Signed)
Okay to schedule.  ASA 2.  She will need to verify coverage for screening colonoscopy prior to age 45 with her insurance company.

## 2020-11-09 NOTE — Telephone Encounter (Signed)
Patient returned call [phone]

## 2020-11-09 NOTE — Patient Instructions (Signed)
Kristin Chang  02-14-1976 MRN: [id number]    Remember Preg Test on 12/14/2020 at [facility].   Procedure Date: 12/16/2020 Time to register: 9:00 am Place to register: [facility] Stay Scheduled provider: Dr. Abbey Chatters    PREPARATION FOR COLONOSCOPY WITH SUPREP BOWEL PREP KIT  Note: Suprep Bowel Prep Kit is a split-dose (2day) regimen. Consumption of BOTH 6-ounce bottles is required for a complete prep.  Please notify us immediately if you are diabetic, take iron supplements, or if you are on Coumadin or any other blood thinners.  Weight loss medications must be held 7 days prior to your procedure.   Please notify us of any medication changes at least 7 days prior to your procedure.   Please hold the following medications: n/a                                                                                                                                                  2 DAYS BEFORE PROCEDURE:  DATE: 12/14/2020   DAY: Wednesday   Begin clear liquid diet AFTER your lunch meal. NO SOLID FOODS after this point.  1 DAY BEFORE PROCEDURE:  DATE: 12/15/2020   DAY: Thursday Continue clear liquids the entire day - NO SOLID FOOD.   Diabetic medications adjustments for today: n/a  At 6:00pm: Complete steps 1 through 4 below, using ONE (1) 6-ounce bottle, before going to bed. Step 1:  Pour ONE (1) 6-ounce bottle of SUPREP liquid into the mixing container.  Step 2:  Add cool drinking water to the 16 ounce line on the container and mix.  Note: Dilute the solution concentrate as directed prior to use. Step 3:  DRINK ALL the liquid in the container. Step 4:  You MUST drink an additional two (2) or more 16 ounce containers of water over the next one (1) hour.   Continue clear liquids.  DAY OF PROCEDURE:   DATE: 12/16/2020   DAY: Friday If you take medications for your heart, blood pressure, or breathing, you may take these medications.  Diabetic medications adjustments  for today: n/a  5 hours before your procedure at 5:30 am: Step 1:  Pour ONE (1) 6-ounce bottle of SUPREP liquid into the mixing container.  Step 2:  Add cool drinking water to the 16 ounce line on the container and mix.  Note: Dilute the solution concentrate as directed prior to use. Step 3:  DRINK ALL the liquid in the container. Step 4:  You MUST drink an additional two (2) or more 16 ounce containers of water over the next one (1) hour. You MUST complete the final glass of water at least 3 hours before your colonoscopy. Nothing by mouth past 7:30 am.  You may take your morning medications with sip of water unless we have instructed otherwise.    Please see  below for Dietary Information.  CLEAR LIQUIDS INCLUDE:  Water Jello (NOT red in color)   Ice Popsicles (NOT red in color)   Tea (sugar ok, no milk/cream) Powdered fruit flavored drinks  Coffee (sugar ok, no milk/cream) Gatorade/ Lemonade/ Kool-Aid  (NOT red in color)   Juice: apple, white grape, white cranberry Soft drinks  Clear bullion, consomme, broth (fat free beef/chicken/vegetable)  Carbonated beverages (any kind)  Strained chicken noodle soup Hard Candy   Remember: Clear liquids are liquids that will allow you to see your fingers on the other side of a clear glass. Be sure liquids are NOT red in color, and not cloudy, but CLEAR.  DO NOT EAT OR DRINK ANY OF THE FOLLOWING:  Dairy products of any kind   Cranberry juice Tomato juice / V8 juice   Grapefruit juice Orange juice     Red grape juice  Do not eat any solid foods, including such foods as: cereal, oatmeal, yogurt, fruits, vegetables, creamed soups, eggs, bread, crackers, pureed foods in a blender, etc.   HELPFUL HINTS FOR DRINKING PREP SOLUTION:  Make sure prep is extremely cold. Mix and refrigerate the the morning of the prep. You may also put in the freezer.  You may try mixing some Crystal Light or Country Time Lemonade if you prefer. Mix in small amounts; add  more if necessary. Try drinking through a straw Rinse mouth with water or a mouthwash between glasses, to remove after-taste. Try sipping on a cold beverage /ice/ popsicles between glasses of prep. Place a piece of sugar-free hard candy in mouth between glasses. If you become nauseated, try consuming smaller amounts, or stretch out the time between glasses. Stop for 30-60 minutes, then slowly start back drinking.     OTHER INSTRUCTIONS  You will need to have a responsible adult immediately available after your procedure to receive discharge instructions then drive you home. We strongly encourage your responsible adult to remain in the hospital during your procedure.  Your procedure will be canceled if a responsible adult is not available.  Wear loose fitting clothing that is easily removed. Leave jewelry and other valuables at home.  Remove all body piercing jewelry and leave at home. Total time from sign-in until discharge is approximately 2-3 hours. You should go home directly after your procedure and rest. You can resume normal activities the day after your procedure. The day of your procedure you should not: Drive Make legal decisions Operate machinery Drink alcohol Return to work   You may call the office (Dept: [phone]) before 5:00pm, or page the doctor on call [phone]) after 5:00pm, for further instructions, if necessary.   Insurance Information YOU WILL NEED TO CHECK WITH YOUR INSURANCE COMPANY FOR THE BENEFITS OF COVERAGE YOU HAVE FOR THIS PROCEDURE.  UNFORTUNATELY, NOT ALL INSURANCE COMPANIES HAVE BENEFITS TO COVER ALL OR PART OF THESE TYPES OF PROCEDURES.  IT IS YOUR RESPONSIBILITY TO CHECK YOUR BENEFITS, HOWEVER, WE WILL BE GLAD TO ASSIST YOU WITH ANY CODES YOUR INSURANCE COMPANY MAY NEED.    PLEASE NOTE THAT MOST INSURANCE COMPANIES WILL NOT COVER A SCREENING COLONOSCOPY FOR PEOPLE UNDER THE AGE OF 50  IF YOU HAVE BCBS INSURANCE, YOU MAY HAVE BENEFITS FOR A  SCREENING COLONOSCOPY BUT IF POLYPS ARE FOUND THE DIAGNOSIS WILL CHANGE AND THEN YOU MAY HAVE A DEDUCTIBLE THAT WILL NEED TO BE MET. SO PLEASE MAKE SURE YOU CHECK YOUR BENEFITS FOR A SCREENING COLONOSCOPY AS WELL AS A DIAGNOSTIC COLONOSCOPY.

## 2020-11-09 NOTE — Progress Notes (Addendum)
Gastroenterology Pre-Procedure Review  Request Date: 11/09/2020 Requesting Physician: Derrek Monaco, NP @ [facility], family hx of colon cancer (grandmother)  PATIENT REVIEW QUESTIONS: The patient responded to the following health history questions as indicated:    1. Diabetes Melitis: no 2. Joint replacements in the past 12 months: no 3. Major health problems in the past 3 months: no 4. Has an artificial valve or MVP: no 5. Has a defibrillator: no 6. Has been advised in past to take antibiotics in advance of a procedure like teeth cleaning: no 7. Family history of colon cancer: yes, maternal grandmother: age unknown 16. Alcohol Use: yes, 4 to 6 drinks a week 9. Illicit drug Use: no 10. History of sleep apnea: no 11. History of coronary artery or other vascular stents placed within the last 12 months: no 12. History of any prior anesthesia complications: no 13. Body mass index is 22.71 kg/m.    MEDICATIONS & ALLERGIES:    Patient reports the following regarding taking any blood thinners:   Plavix? no Aspirin? no Coumadin? no Brilinta? no Xarelto? no Eliquis? no Pradaxa? no Savaysa? no Effient? no  Patient confirms/reports the following medications:  Current Outpatient Medications  Medication Sig Dispense Refill   citalopram (CELEXA) 40 MG tablet Take 1 tablet (40 mg total) by mouth daily. 90 tablet 3   Guselkumab (TREMFYA) 100 MG/ML SOSY Inject 100 mg into the skin every 8 (eight) weeks. 1 mL 4   No current facility-administered medications for this visit.    Patient confirms/reports the following allergies:  Allergies  Allergen Reactions   Humira [Adalimumab] Hives    No orders of the defined types were placed in this encounter.   AUTHORIZATION INFORMATION Primary Insurance: [place],  [place] #: [id number],  Group #: [id number] Pre-Cert / Josem Kaufmann required: No, not required  SCHEDULE INFORMATION: Procedure has been scheduled as follows:  Date:  12/16/2020, Time: 10:30  Location: [facility] with Dr. Abbey Chatters  This Gastroenterology Pre-Precedure Review Form is being routed to the following provider(s): Aliene Altes, PA-C

## 2020-11-09 NOTE — Progress Notes (Signed)
Pt made aware that she will need preg test done at [facility] on 12/14/2020.

## 2020-11-10 ENCOUNTER — Other Ambulatory Visit: Payer: Self-pay | Admitting: *Deleted

## 2020-11-10 DIAGNOSIS — Z1211 Encounter for screening for malignant neoplasm of colon: Secondary | ICD-10-CM

## 2020-11-11 NOTE — Progress Notes (Signed)
[facility] and spoke to [place].  She informed me that there is no age criteria for colonoscopies.  Ref#: [id number]

## 2020-11-18 ENCOUNTER — Ambulatory Visit: Payer: BC Managed Care – PPO

## 2020-11-28 ENCOUNTER — Ambulatory Visit: Payer: BC Managed Care – PPO

## 2020-11-30 ENCOUNTER — Other Ambulatory Visit: Payer: Self-pay

## 2020-11-30 ENCOUNTER — Ambulatory Visit: Payer: BC Managed Care – PPO | Admitting: Physician Assistant

## 2020-11-30 ENCOUNTER — Ambulatory Visit: Payer: BC Managed Care – PPO

## 2020-11-30 ENCOUNTER — Ambulatory Visit
Admission: RE | Admit: 2020-11-30 | Discharge: 2020-11-30 | Disposition: A | Discharge status: Home / Self Care | Payer: BC Managed Care – PPO | Source: Ambulatory Visit | Attending: Adult Health | Admitting: Adult Health

## 2020-11-30 ENCOUNTER — Encounter: Payer: Self-pay | Admitting: Physician Assistant

## 2020-11-30 DIAGNOSIS — L700 Acne vulgaris: Secondary | ICD-10-CM

## 2020-11-30 DIAGNOSIS — Z1231 Encounter for screening mammogram for malignant neoplasm of breast: Secondary | ICD-10-CM

## 2020-11-30 MED ORDER — MINOCYCLINE HCL 100 MG PO CAPS: 100.0000 mg | ORAL_CAPSULE | Freq: Two times a day (BID) | ORAL | 0 refills | Status: DC

## 2020-11-30 NOTE — Patient Instructions (Addendum)
Call in 2 weeks.   Spironolactone Tablets What is this medication? SPIRONOLACTONE (speer on oh LAK tone) treats high blood pressure and heart failure. It may also be used to reduce swelling related to heart, kidney, or liver disease. It helps your kidneys remove more fluid and salt from your blood through the urine without losing too much potassium. It belongs to a group of medications called diuretics. This medicine may be used for other purposes; ask your health care provider or pharmacist if you have questions. COMMON BRAND NAME(S): Aldactone What should I tell my care team before I take this medication? They need to know if you have any of these conditions: Addison's disease or low adrenal gland function High blood level of potassium Kidney disease Liver disease An unusual or allergic reaction to spironolactone, other medications, foods, dyes, or preservatives Pregnant or trying to get pregnant Breast-feeding How should I use this medication? Take this medication by mouth. Take it as directed on the prescription label at the same time every day. You can take it with or without food. You should always take it the same way. Keep taking it unless your care team tells you to stop. Talk to your care team about the use of this medication in children. Special care may be needed. Overdosage: If you think you have taken too much of this medicine contact a poison control center or emergency room at once. NOTE: This medicine is only for you. Do not share this medicine with others. What if I miss a dose? If you miss a dose, take it as soon as you can. If it is almost time for your next dose, take only that dose. Do not take double or extra doses. What may interact with this medication? Do not take this medication with any of the following: Cidofovir Eplerenone Tranylcypromine This medication may also interact with the following: Aspirin Certain medications for blood pressure or heart disease  like benazepril, lisinopril, losartan, valsartan Certain medications that treat or prevent blood clots like heparin and enoxaparin Cholestyramine Cyclosporine Digoxin Lithium Medications that relax muscles for surgery NSAIDs, medications for pain and inflammation, like ibuprofen or naproxen Other diuretics Potassium salts or supplements Steroid medications like prednisone or cortisone Trimethoprim This list may not describe all possible interactions. Give your health care provider a list of all the medicines, herbs, non-prescription drugs, or dietary supplements you use. Also tell them if you smoke, drink alcohol, or use illegal drugs. Some items may interact with your medicine. What should I watch for while using this medication? Visit your care team for regular checks on your progress. Check your blood pressure as directed. Ask your care team what your blood pressure should be. Also, find out when you should contact him or her. Do not treat yourself for coughs, colds, or pain while you are using this medication without asking your care team for advice. Some medications may increase your blood pressure. Check with your care team if you have severe diarrhea, nausea, and vomiting, or if you sweat a lot. The loss of too much body fluid may make it dangerous for you to take this medication. You may need to be on a special diet while taking this medication. Ask your care team. Also, find out how many glasses of fluid you need to drink each day. You may get drowsy or dizzy. Do not drive, use machinery, or do anything that needs mental alertness until you know how this medication affects you. Do not stand or sit  up quickly, especially if you are an older patient. This reduces the risk of dizzy or fainting spells. Alcohol may interfere with the effects of this medication. Avoid alcoholic drinks. Avoid salt substitutes unless you are told otherwise by your care team. What side effects may I notice from  receiving this medication? Side effects that you should report to your care team as soon as possible: Allergic reactions-skin rash, itching, hives, swelling of the face, lips, tongue, or throat Dehydration-increased thirst, dry mouth, feeling faint or lightheaded, headache, dark yellow or brown urine High potassium level-muscle weakness, fast or irregular heartbeat Kidney injury-decrease in the amount of urine, swelling of the ankles, hands, or feet Low blood pressure-dizziness, feeling faint or lightheaded, blurry vision Low sodium level-muscle weakness, fatigue, dizziness, headache, confusion Side effects that usually do not require medical attention (report to your care team if they continue or are bothersome): Breast pain or tenderness Changes in sex drive or performance Dizziness Headache Irregular menstrual cycles or spotting Unexpected breast tissue growth This list may not describe all possible side effects. Call your doctor for medical advice about side effects. You may report side effects to FDA at 1-800-FDA-1088. Where should I keep my medication? Keep out of the reach of children and pets. Store below 25 degrees C (77 degrees F). Get rid of any unused medication after the expiration date. To get rid of medications that are no longer needed or have expired: Take the medication to a medication take-back program. Check with your pharmacy or law enforcement to find a location. If you cannot return the medication, check the label or package insert to see if the medication should be thrown out in the garbage or flushed down the toilet. If you are not sure, ask your care team. If it is safe to put into the trash, take the medication out of the container. Mix the medication with cat litter, dirt, coffee grounds, or other unwanted substance. Seal the mixture in a bag or container. Put it in the trash. NOTE: This sheet is a summary. It may not cover all possible information. If you have  questions about this medicine, talk to your doctor, pharmacist, or health care provider.  2022 Elsevier/Gold Standard (2020-05-12 13:01:04)

## 2020-11-30 NOTE — Progress Notes (Signed)
   Follow-Up Visit   Subjective  Kristin Chang is a 45 y.o. female who presents for the following: Skin Problem (Knot under eye x 1 week. She says there was 2 bumps and her eye was swollen. It was painful. The bumps have gone down since then. ).   The following portions of the chart were reviewed this encounter and updated as appropriate:  Tobacco  Allergies  Meds  Problems  Med Hx  Surg Hx  Fam Hx      Objective  Well appearing patient in no apparent distress; mood and affect are within normal limits.  A focused examination was performed including face. Relevant physical exam findings are noted in the Assessment and Plan.  Left Malar Cheek Small nodule-slightly dense.   Assessment & Plan  Cystic acne Left Malar Cheek  Possible spironolactone if antibiotic improves the papule. Biopsy if it fails to resolve.  minocycline (MINOCIN) 100 MG capsule - Left Malar Cheek Take 1 capsule (100 mg total) by mouth 2 (two) times daily.    I, Akansha Wyche, PA-C, have reviewed all documentation's for this visit.  The documentation on 11/30/20 for the exam, diagnosis, procedures and orders are all accurate and complete.

## 2020-12-14 ENCOUNTER — Other Ambulatory Visit: Payer: Self-pay

## 2020-12-14 ENCOUNTER — Other Ambulatory Visit (HOSPITAL_COMMUNITY)
Admission: RE | Admit: 2020-12-14 | Discharge: 2020-12-14 | Disposition: A | Discharge status: Home / Self Care | Payer: BC Managed Care – PPO | Source: Ambulatory Visit | Attending: Internal Medicine | Admitting: Internal Medicine

## 2020-12-14 DIAGNOSIS — Z800 Family history of malignant neoplasm of digestive organs: Secondary | ICD-10-CM | POA: Diagnosis not present

## 2020-12-14 DIAGNOSIS — Z803 Family history of malignant neoplasm of breast: Secondary | ICD-10-CM | POA: Diagnosis not present

## 2020-12-14 DIAGNOSIS — K6389 Other specified diseases of intestine: Secondary | ICD-10-CM | POA: Diagnosis not present

## 2020-12-14 DIAGNOSIS — Z79899 Other long term (current) drug therapy: Secondary | ICD-10-CM | POA: Diagnosis not present

## 2020-12-14 DIAGNOSIS — Z87891 Personal history of nicotine dependence: Secondary | ICD-10-CM | POA: Diagnosis not present

## 2020-12-14 DIAGNOSIS — Z1211 Encounter for screening for malignant neoplasm of colon: Secondary | ICD-10-CM | POA: Insufficient documentation

## 2020-12-14 DIAGNOSIS — Z888 Allergy status to other drugs, medicaments and biological substances status: Secondary | ICD-10-CM | POA: Diagnosis not present

## 2020-12-14 DIAGNOSIS — Z8249 Family history of ischemic heart disease and other diseases of the circulatory system: Secondary | ICD-10-CM | POA: Diagnosis not present

## 2020-12-14 LAB — PREGNANCY, URINE: Preg Test, Ur: NEGATIVE

## 2020-12-16 ENCOUNTER — Encounter (HOSPITAL_COMMUNITY): Payer: Self-pay

## 2020-12-16 ENCOUNTER — Ambulatory Visit (HOSPITAL_COMMUNITY): Payer: BC Managed Care – PPO | Admitting: Anesthesiology

## 2020-12-16 ENCOUNTER — Other Ambulatory Visit: Payer: Self-pay

## 2020-12-16 ENCOUNTER — Ambulatory Visit (HOSPITAL_COMMUNITY)
Admission: RE | Admit: 2020-12-16 | Discharge: 2020-12-16 | Disposition: A | Discharge status: Home / Self Care | Payer: BC Managed Care – PPO | Source: Ambulatory Visit | Attending: Internal Medicine | Admitting: Internal Medicine

## 2020-12-16 ENCOUNTER — Encounter (HOSPITAL_COMMUNITY)
Admission: RE | Disposition: A | Discharge status: Home / Self Care | Payer: Self-pay | Source: Ambulatory Visit | Attending: Internal Medicine

## 2020-12-16 DIAGNOSIS — Z800 Family history of malignant neoplasm of digestive organs: Secondary | ICD-10-CM | POA: Insufficient documentation

## 2020-12-16 DIAGNOSIS — Z803 Family history of malignant neoplasm of breast: Secondary | ICD-10-CM | POA: Insufficient documentation

## 2020-12-16 DIAGNOSIS — Z888 Allergy status to other drugs, medicaments and biological substances status: Secondary | ICD-10-CM | POA: Insufficient documentation

## 2020-12-16 DIAGNOSIS — Z1211 Encounter for screening for malignant neoplasm of colon: Secondary | ICD-10-CM | POA: Insufficient documentation

## 2020-12-16 DIAGNOSIS — Z87891 Personal history of nicotine dependence: Secondary | ICD-10-CM | POA: Insufficient documentation

## 2020-12-16 DIAGNOSIS — Z79899 Other long term (current) drug therapy: Secondary | ICD-10-CM | POA: Insufficient documentation

## 2020-12-16 DIAGNOSIS — Z8249 Family history of ischemic heart disease and other diseases of the circulatory system: Secondary | ICD-10-CM | POA: Insufficient documentation

## 2020-12-16 DIAGNOSIS — K6389 Other specified diseases of intestine: Secondary | ICD-10-CM | POA: Insufficient documentation

## 2020-12-16 HISTORY — PX: COLONOSCOPY WITH PROPOFOL: SHX5780

## 2020-12-16 SURGERY — COLONOSCOPY WITH PROPOFOL
Anesthesia: General

## 2020-12-16 MED ORDER — LACTATED RINGERS IV SOLN: INTRAVENOUS | Status: DC

## 2020-12-16 MED ORDER — PROPOFOL 10 MG/ML IV BOLUS
INTRAVENOUS | Status: DC | PRN
  Administered 2020-12-16: 40 mg via INTRAVENOUS
  Administered 2020-12-16 (×2): 30 mg via INTRAVENOUS
  Administered 2020-12-16: 40 mg via INTRAVENOUS
  Administered 2020-12-16: 30 mg via INTRAVENOUS
  Administered 2020-12-16: 130 mg via INTRAVENOUS

## 2020-12-16 MED ORDER — LIDOCAINE HCL (CARDIAC) PF 100 MG/5ML IV SOSY
PREFILLED_SYRINGE | INTRAVENOUS | Status: DC | PRN
  Administered 2020-12-16: 50 mg via INTRAVENOUS

## 2020-12-16 NOTE — H&P (Signed)
Primary Care Physician:  Sharilyn Sites, MD Primary Gastroenterologist:  Dr. Abbey Chatters  Pre-Procedure History & Physical: HPI:  Kristin Chang is a 45 y.o. female is here for a colonoscopy for colon cancer screening purposes.  Patient denies any family history of colorectal cancer.  No melena or hematochezia.  No abdominal pain or unintentional weight loss.  No change in bowel habits.  Overall feels well from a GI standpoint.  Past Medical History:  Diagnosis Date   Atypical mole 12/12/2004   Left Upper Inner Scapula (moderate to marked) (excision)   Atypical mole 12/12/2004   Left Lower Abdomen (moderate) (widershave)   Atypical mole 03/21/2005   Right Post Shoulder (slight to moderate)   Atypical mole 11/18/2006   Left Abdomen (slight to moderate) Mindi Slicker)   Atypical mole 10/11/2010   Right Post Hip (mild)   Atypical mole 06/03/2013   Mid Back (severe) (excision)   Atypical mole 07/16/2013   Right Mid Back (moderate)   Atypical mole 07/16/2013   Left Mid Back Sup (moderate)   Atypical mole 07/16/2013   Left Mid Back Inf (moderate)   Atypical mole 07/06/2015   Right Outer Abdomen (mild)   Atypical mole 03/07/2016   Right Outer Abdomen (moderate)   Atypical mole 03/07/2016   Right Lower Back (moderate) (widershave)   Atypical mole 03/07/2016   Left Post Neck (moderate to severe) Mindi Slicker)   Atypical mole 03/31/2019   Left Upper Inner Scapula (moderate recurrent)   Depression    Psoriasis    SCCA (squamous cell carcinoma) of skin 12/15/2014   Left Shin (Keratoacanthoma) (tx p bx)    Past Surgical History:  Procedure Laterality Date   HYSTEROSCOPY WITH D & C  12/03/2011   Procedure: DILATATION AND CURETTAGE /HYSTEROSCOPY;  Surgeon: Jonnie Kind, MD;  Location: [facility];  Service: Gynecology;  Laterality: N/A;  Hysteroscopy with excision of prolapsed cervical fibroid, done at [place]  12/03/2011   Procedure: LAPAROSCOPIC TUBAL LIGATION;   Surgeon: Jonnie Kind, MD;  Location: [facility];  Service: Gynecology;  Laterality: N/A;  started at [place]      Prior to Admission medications   Medication Sig Start Date End Date Taking? Authorizing Provider  citalopram (CELEXA) 40 MG tablet Take 1 tablet (40 mg total) by mouth daily. 12/01/19  Yes Florian Buff, MD  OVER THE COUNTER MEDICATION Take 2 each by mouth at bedtime as needed (sleep). CBD Gummies   Yes [provider]  Guselkumab (TREMFYA) 100 MG/ML SOSY Inject 100 mg into the skin every 8 (eight) weeks. Patient taking differently: Inject 100 mg into the skin See admin instructions. Every 2 months 05/30/20   Lavonna Monarch, MD  minocycline (MINOCIN) 100 MG capsule Take 1 capsule (100 mg total) by mouth 2 (two) times daily. Patient not taking: Reported on 12/13/2020 11/30/20 12/30/20  Robyne Askew R, PA-C  Na Sulfate-K Sulfate-Mg Sulf 17.5-3.13-1.6 GM/177ML SOLN Take by mouth. 11/10/20   [provider]    Allergies as of 11/10/2020 - Review Complete 11/09/2020  Allergen Reaction Noted   Humira [adalimumab] Hives 07/24/2017    Family History  Problem Relation Age of Onset   Breast cancer Mother    Cancer Mother        breast   Cancer Maternal Grandmother        colon   Heart disease Maternal Grandmother     Social History   Socioeconomic  History   Marital status: Married    Spouse name: Not on file   Number of children: Not on file   Years of education: Not on file   Highest education level: Not on file  Occupational History   Not on file  Tobacco Use   Smoking status: Former    Years: 4.00    Types: Cigarettes   Smokeless tobacco: Never  Vaping Use   Vaping Use: Never used  Substance and Sexual Activity   Alcohol use: Not Currently    Alcohol/week: 3.0 standard drinks    Types: 3 Cans of beer per week    Comment: socially   Drug use: No   Sexual activity: Yes    Birth control/protection: Surgical     Comment: tubal  Other Topics Concern   Not on file  Social History Narrative   Not on file   Social Determinants of Health   Financial Resource Strain: Low Risk    Difficulty of Paying Living Expenses: Not hard at all  Food Insecurity: No Food Insecurity   Worried About Charity fundraiser in the Last Year: Never true   Ran Out of Food in the Last Year: Never true  Transportation Needs: No Transportation Needs   Lack of Transportation (Medical): No   Lack of Transportation (Non-Medical): No  Physical Activity: Sufficiently Active   Days of Exercise per Week: 5 days   Minutes of Exercise per Session: 40 min  Stress: No Stress Concern Present   Feeling of Stress : Not at all  Social Connections: Moderately Isolated   Frequency of Communication with Friends and Family: Three times a week   Frequency of Social Gatherings with Friends and Family: Once a week   Attends Religious Services: Never   Marine scientist or Organizations: No   Attends Music therapist: Never   Marital Status: Married  Human resources officer Violence: Not At Risk   Fear of Current or Ex-Partner: No   Emotionally Abused: No   Physically Abused: No   Sexually Abused: No    Review of Systems: See HPI, otherwise negative ROS  Physical Exam: Vital signs in last 24 hours: Temp:  [98.4 F (36.9 C)] 98.4 F (36.9 C) (10/21 0914) Resp:  [14] 14 (10/21 0914) BP: (106)/(64) 106/64 (10/21 0914) SpO2:  [99 %] 99 % (10/21 0914) Weight:  [65.8 kg] 65.8 kg (10/21 0914)   General:   Alert,  Well-developed, well-nourished, pleasant and cooperative in NAD Head:  Normocephalic and atraumatic. Eyes:  Sclera clear, no icterus.   Conjunctiva pink. Ears:  Normal auditory acuity. Nose:  No deformity, discharge,  or lesions. Mouth:  No deformity or lesions, dentition normal. Neck:  Supple; no masses or thyromegaly. Lungs:  Clear throughout to auscultation.   No wheezes, crackles, or rhonchi. No acute  distress. Heart:  Regular rate and rhythm; no murmurs, clicks, rubs,  or gallops. Abdomen:  Soft, nontender and nondistended. No masses, hepatosplenomegaly or hernias noted. Normal bowel sounds, without guarding, and without rebound.   Msk:  Symmetrical without gross deformities. Normal posture. Extremities:  Without clubbing or edema. Neurologic:  Alert and  oriented x4;  grossly normal neurologically. Skin:  Intact without significant lesions or rashes. Cervical Nodes:  No significant cervical adenopathy. Psych:  Alert and cooperative. Normal mood and affect.  Impression/Plan: Ethelyn Cerniglia is here for a colonoscopy to be performed for colon cancer screening purposes.  The risks of the procedure including infection, bleed, or  perforation as well as benefits, limitations, alternatives and imponderables have been reviewed with the patient. Questions have been answered. All parties agreeable.

## 2020-12-16 NOTE — Anesthesia Postprocedure Evaluation (Signed)
Anesthesia Post Note  Patient: Kristin Chang  Procedure(s) Performed: COLONOSCOPY WITH PROPOFOL  Patient location during evaluation: PACU Anesthesia Type: General Level of consciousness: awake and alert and oriented Pain management: pain level controlled Vital Signs Assessment: post-procedure vital signs reviewed and stable Respiratory status: spontaneous breathing, nonlabored ventilation and respiratory function stable Cardiovascular status: blood pressure returned to baseline and stable Postop Assessment: no apparent nausea or vomiting Anesthetic complications: no   No notable events documented.   Last Vitals:  Vitals:   12/16/20 0914 12/16/20 1056  BP: 106/64 (!) 93/53  Resp: 14 19  Temp: 36.9 C 36.6 C  SpO2: 99% 99%    Last Pain:  Vitals:   12/16/20 1056  TempSrc: Oral  PainSc: 0-No pain                 Aras Albarran C Breuna Loveall

## 2020-12-16 NOTE — Anesthesia Procedure Notes (Signed)
Date/Time: 12/16/2020 10:40 AM Performed by: Orlie Dakin, CRNA Pre-anesthesia Checklist: Patient identified, Emergency Drugs available, Suction available and Patient being monitored Patient Re-evaluated:Patient Re-evaluated prior to induction Oxygen Delivery Method: Nasal cannula Induction Type: IV induction Placement Confirmation: positive ETCO2

## 2020-12-16 NOTE — Discharge Instructions (Addendum)

## 2020-12-16 NOTE — Anesthesia Preprocedure Evaluation (Signed)
Anesthesia Evaluation  Patient identified by MRN, date of birth, ID band Patient awake    Reviewed: Allergy & Precautions, NPO status , Patient's Chart, lab work & pertinent test results  Airway Mallampati: I  TM Distance: >3 FB Neck ROM: Full    Dental  (+) Dental Advisory Given, Teeth Intact   Pulmonary neg pulmonary ROS, former smoker,    Pulmonary exam normal breath sounds clear to auscultation       Cardiovascular negative cardio ROS Normal cardiovascular exam Rhythm:Regular Rate:Bradycardia     Neuro/Psych PSYCHIATRIC DISORDERS Anxiety Depression negative neurological ROS     GI/Hepatic negative GI ROS, Neg liver ROS,   Endo/Other  negative endocrine ROS  Renal/GU negative Renal ROS  negative genitourinary   Musculoskeletal negative musculoskeletal ROS (+)   Abdominal   Peds negative pediatric ROS (+)  Hematology negative hematology ROS (+)   Anesthesia Other Findings   Reproductive/Obstetrics negative OB ROS                             Anesthesia Physical Anesthesia Plan  ASA: 2  Anesthesia Plan: General   Post-op Pain Management:    Induction: Intravenous  PONV Risk Score and Plan: TIVA  Airway Management Planned: Nasal Cannula and Natural Airway  Additional Equipment:   Intra-op Plan:   Post-operative Plan:   Informed Consent: I have reviewed the patients History and Physical, chart, labs and discussed the procedure including the risks, benefits and alternatives for the proposed anesthesia with the patient or authorized representative who has indicated his/her understanding and acceptance.     Dental advisory given  Plan Discussed with: CRNA and Surgeon  Anesthesia Plan Comments:         Anesthesia Quick Evaluation

## 2020-12-16 NOTE — Op Note (Signed)
[facility] Patient Name: Kristin Chang Procedure Date: 12/16/2020 10:25 AM MRN: [id number] Date of Birth: [birth year] Attending MD: Elon Alas. Abbey Chatters DO CSN: [id number] Age: 45 Admit Type: Outpatient Procedure:                Colonoscopy Indications:              Screening for colorectal malignant neoplasm Providers:                Elon Alas. Abbey Chatters, DO, Lambert Mody, Lurline Del, RN, Casimer Bilis, Technician,                            Raphael Gibney, Technician Referring MD:              Medicines:                See the Anesthesia note for documentation of the                            administered medications Complications:            No immediate complications. Estimated Blood Loss:     Estimated blood loss: none. Procedure:                Pre-Anesthesia Assessment:                           - The anesthesia plan was to use monitored                            anesthesia care (MAC).                           After obtaining informed consent, the colonoscope                            was passed under direct vision. Throughout the                            procedure, the patient's blood pressure, pulse, and                            oxygen saturations were monitored continuously. The                            [id number]-HQ190L ([id number]) scope was introduced through                            the anus and advanced to the the cecum, identified                            by appendiceal orifice and ileocecal valve. The                            colonoscopy was performed without difficulty. The  patient tolerated the procedure well. The quality                            of the bowel preparation was evaluated using the                            BBPS [facility] Bowel Preparation Scale) with scores                            of: Right Colon = 3, Transverse Colon = 3 and Left                            Colon = 3  (entire mucosa seen well with no residual                            staining, small fragments of stool or opaque                            liquid). The total BBPS score equals 9. Scope In: 10:38:51 AM Scope Out: 10:53:11 AM Scope Withdrawal Time: 0 hours 7 minutes 54 seconds  Total Procedure Duration: 0 hours 14 minutes 20 seconds  Findings:      The perianal and digital rectal examinations were normal.      Diffuse melanosis was found in the entire colon.      The exam was otherwise without abnormality. Impression:               - Melanosis in the colon.                           - The examination was otherwise normal.                           - No specimens collected. Moderate Sedation:      Per Anesthesia Care Recommendation:           - Patient has a contact number available for                            emergencies. The signs and symptoms of potential                            delayed complications were discussed with the                            patient. Return to normal activities tomorrow.                            Written discharge instructions were provided to the                            patient.                           - Resume previous diet.                           -  Continue present medications.                           - Repeat colonoscopy in 10 years for screening                            purposes.                           - Return to GI clinic PRN. Procedure Code(s):        --- Professional ---                           V2536, Colorectal cancer screening; colonoscopy on                            individual not meeting criteria for high risk Diagnosis Code(s):        --- Professional ---                           Z12.11, Encounter for screening for malignant                            neoplasm of colon                           K63.89, Other specified diseases of intestine CPT copyright 2019 American Medical Association. All rights reserved. The  codes documented in this report are preliminary and upon coder review may  be revised to meet current compliance requirements. Elon Alas. Abbey Chatters, DO [place] Abbey Chatters, DO 12/16/2020 10:55:59 AM This report has been signed electronically. Number of Addenda: 0

## 2020-12-16 NOTE — Transfer of Care (Signed)
Immediate Anesthesia Transfer of Care Note  Patient: Kristin Chang  Procedure(s) Performed: COLONOSCOPY WITH PROPOFOL  Patient Location: Endoscopy Unit  Anesthesia Type:General  Level of Consciousness: awake and alert   Airway & Oxygen Therapy: Patient Spontanous Breathing  Post-op Assessment: Report given to RN and Post -op Vital signs reviewed and stable  Post vital signs: Reviewed and stable  Last Vitals:  Vitals Value Taken Time  BP    Temp    Pulse    Resp    SpO2      Last Pain:  Vitals:   12/16/20 1036  TempSrc:   PainSc: 0-No pain      Patients Stated Pain Goal: 8 ([birth year] 6282)  Complications: No notable events documented.

## 2020-12-17 ENCOUNTER — Other Ambulatory Visit: Payer: Self-pay | Admitting: Obstetrics & Gynecology

## 2020-12-21 ENCOUNTER — Encounter (HOSPITAL_COMMUNITY): Payer: Self-pay | Admitting: Internal Medicine

## 2021-03-28 ENCOUNTER — Other Ambulatory Visit: Payer: Self-pay | Admitting: Dermatology

## 2021-04-24 ENCOUNTER — Other Ambulatory Visit: Payer: Self-pay

## 2021-04-24 ENCOUNTER — Ambulatory Visit
Admission: RE | Admit: 2021-04-24 | Discharge: 2021-04-24 | Disposition: A | Discharge status: Home / Self Care | Payer: BC Managed Care – PPO | Source: Ambulatory Visit | Attending: Urgent Care | Admitting: Urgent Care

## 2021-04-24 VITALS — BP 136/81 | HR 51 | Temp 98.4°F | Resp 18

## 2021-04-24 DIAGNOSIS — U071 COVID-19: Secondary | ICD-10-CM | POA: Diagnosis not present

## 2021-04-24 DIAGNOSIS — R0789 Other chest pain: Secondary | ICD-10-CM

## 2021-04-24 DIAGNOSIS — R052 Subacute cough: Secondary | ICD-10-CM

## 2021-04-24 DIAGNOSIS — J3489 Other specified disorders of nose and nasal sinuses: Secondary | ICD-10-CM | POA: Diagnosis not present

## 2021-04-24 MED ORDER — BENZONATATE 100 MG PO CAPS: 100.0000 mg | ORAL_CAPSULE | Freq: Three times a day (TID) | ORAL | 0 refills | Status: DC | PRN

## 2021-04-24 MED ORDER — LEVOCETIRIZINE DIHYDROCHLORIDE 5 MG PO TABS: 5.0000 mg | ORAL_TABLET | Freq: Every evening | ORAL | 0 refills | Status: DC

## 2021-04-24 MED ORDER — PROMETHAZINE-DM 6.25-15 MG/5ML PO SYRP: 5.0000 mL | ORAL_SOLUTION | Freq: Every evening | ORAL | 0 refills | Status: DC | PRN

## 2021-04-24 MED ORDER — PSEUDOEPHEDRINE HCL 60 MG PO TABS: 60.0000 mg | ORAL_TABLET | Freq: Three times a day (TID) | ORAL | 0 refills | Status: DC | PRN

## 2021-04-24 NOTE — ED Triage Notes (Signed)
Pt reports cough, nasal congestion, headache x x 5 days.   Pt reports + COVID (home test) on 04/23/2021.

## 2021-04-24 NOTE — ED Provider Notes (Signed)
Kristin Chang   MRN: [id number] DOB: 1975/12/29  Subjective:   Kristin Chang is a 46 y.o. female presenting for 5-day history of acute onset persistent malaise, fatigue, coughing, sinus congestion, sinus headaches.  Has also had some chest pressure.  Today the cough is better but still wanted to be checked.  She did multiple COVID tests last week and then yesterday she was finally positive.  No history of respiratory disorders.  No smoking.  No current facility-administered medications for this encounter.  Current Outpatient Medications:    citalopram (CELEXA) 40 MG tablet, Take 1 tablet by mouth once daily, Disp: 90 tablet, Rfl: 3   Na Sulfate-K Sulfate-Mg Sulf 17.5-3.13-1.6 GM/177ML SOLN, Take by mouth., Disp: , Rfl:    OVER THE COUNTER MEDICATION, Take 2 each by mouth at bedtime as needed (sleep). CBD Gummies, Disp: , Rfl:    TREMFYA 100 MG/ML SOSY, INJECT 1 SYRINGE UNDER THE SKIN EVERY 8 WEEKS., Disp: 1 mL, Rfl: 0   Allergies  Allergen Reactions   Humira [Adalimumab] Hives    Past Medical History:  Diagnosis Date   Atypical mole 12/12/2004   Left Upper Inner Scapula (moderate to marked) (excision)   Atypical mole 12/12/2004   Left Lower Abdomen (moderate) (widershave)   Atypical mole 03/21/2005   Right Post Shoulder (slight to moderate)   Atypical mole 11/18/2006   Left Abdomen (slight to moderate) (widershave)   Atypical mole 10/11/2010   Right Post Hip (mild)   Atypical mole 06/03/2013   Mid Back (severe) (excision)   Atypical mole 07/16/2013   Right Mid Back (moderate)   Atypical mole 07/16/2013   Left Mid Back Sup (moderate)   Atypical mole 07/16/2013   Left Mid Back Inf (moderate)   Atypical mole 07/06/2015   Right Outer Abdomen (mild)   Atypical mole 03/07/2016   Right Outer Abdomen (moderate)   Atypical mole 03/07/2016   Right Lower Back (moderate) (widershave)   Atypical mole 03/07/2016   Left Post Neck (moderate to severe)  Mindi Slicker)   Atypical mole 03/31/2019   Left Upper Inner Scapula (moderate recurrent)   Depression    Psoriasis    SCCA (squamous cell carcinoma) of skin 12/15/2014   Left Shin (Keratoacanthoma) (tx p bx)     Past Surgical History:  Procedure Laterality Date   COLONOSCOPY WITH PROPOFOL N/A 12/16/2020   Procedure: COLONOSCOPY WITH PROPOFOL;  Surgeon: Eloise Harman, DO;  Location: [facility];  Service: Endoscopy;  Laterality: N/A;  10:30 / ASA II   HYSTEROSCOPY WITH D & C  12/03/2011   Procedure: DILATATION AND CURETTAGE /HYSTEROSCOPY;  Surgeon: Jonnie Kind, MD;  Location: [facility];  Service: Gynecology;  Laterality: N/A;  Hysteroscopy with excision of prolapsed cervical fibroid, done at [place]  12/03/2011   Procedure: LAPAROSCOPIC TUBAL LIGATION;  Surgeon: Jonnie Kind, MD;  Location: [facility];  Service: Gynecology;  Laterality: N/A;  started at 49   LEEP  1995   TUBAL LIGATION      Family History  Problem Relation Age of Onset   Breast cancer Mother    Cancer Mother        breast   Cancer Maternal Grandmother        colon   Heart disease Maternal Grandmother     Social History   Tobacco Use   Smoking status: Former    Years: 4.00    Types: Cigarettes   Smokeless tobacco: Never  Vaping Use  Vaping Use: Never used  Substance Use Topics   Alcohol use: Not Currently    Alcohol/week: 3.0 standard drinks    Types: 3 Cans of beer per week    Comment: socially   Drug use: No    ROS   Objective:   Vitals: BP 136/81 (BP Location: Right Arm)    Pulse (!) 51    Temp 98.4 F (36.9 C) (Oral)    Resp 18    LMP  (Within Weeks) Comment: 3 weeks   SpO2 96%   Physical Exam Constitutional:      General: She is not in acute distress.    Appearance: Normal appearance. She is well-developed. She is not ill-appearing, toxic-appearing or diaphoretic.  HENT:     Head: Normocephalic and atraumatic.     Right Ear: External ear normal.      Left Ear: External ear normal.     Nose: Nose normal.     Mouth/Throat:     Mouth: Mucous membranes are moist.  Eyes:     General: No scleral icterus.       Right eye: No discharge.        Left eye: No discharge.     Extraocular Movements: Extraocular movements intact.  Cardiovascular:     Rate and Rhythm: Normal rate.     Heart sounds: No murmur heard.   No friction rub. No gallop.  Pulmonary:     Effort: Pulmonary effort is normal. No respiratory distress.     Breath sounds: No stridor. No wheezing, rhonchi or rales.  Chest:     Chest wall: No tenderness.  Skin:    General: Skin is warm and dry.  Neurological:     General: No focal deficit present.     Mental Status: She is alert and oriented to person, place, and time.  Psychiatric:        Mood and Affect: Mood normal.        Behavior: Behavior normal.    Assessment and Plan :   PDMP not reviewed this encounter.  1. Clinical diagnosis of COVID-19   2. Sinus pressure   3. Subacute cough   4. Chest pressure    Deferred imaging given clear cardiopulmonary exam, hemodynamically stable vital signs. COVID confirmation testing pending. Will manage COVID-19 with supportive care. Counseled patient on nature of COVID-19 including modes of transmission, diagnostic testing, management and supportive care.  Offered symptomatic relief. Counseled patient on potential for adverse effects with medications prescribed/recommended today, strict ER and return-to-clinic precautions discussed, patient verbalized understanding.     Jaynee Eagles, PA-C 04/24/21 1240

## 2021-04-24 NOTE — Discharge Instructions (Addendum)
We will manage this as a viral illness. For sore throat or cough try using a honey-based tea. Use 3 teaspoons of honey with juice squeezed from half lemon. Place shaved pieces of ginger into 1/2-1 cup of water and warm over stove top. Then mix the ingredients and repeat every 4 hours as needed. Please take ibuprofen 600mg  every 6 hours with food alternating with OR taken together with Tylenol 500mg -650mg  every 6 hours for throat pain, fevers, aches and pains. Hydrate very well with at least 2 liters of water. Eat light meals such as soups (chicken and noodles, vegetable, chicken and wild rice).  Do not eat foods that you are allergic to.  Taking an antihistamine like Xyzal can help against postnasal drainage, sinus congestion.  You can take this together with pseudoephedrine (Sudafed) at a dose of 30-60 mg 3 times a day or twice daily as needed for the same kind of nasal drip, congestion. Use the cough medications as needed.

## 2021-04-25 LAB — NOVEL CORONAVIRUS, NAA: SARS-CoV-2, NAA: DETECTED — AB

## 2021-07-03 ENCOUNTER — Telehealth: Payer: Self-pay | Admitting: Dermatology

## 2021-07-03 NOTE — Telephone Encounter (Signed)
Patient left message on office voice mail asking if we had received a prior authorization for Tremfya from [employer]. ?

## 2021-07-03 NOTE — Telephone Encounter (Signed)
Phone call to patient needed an appointment for TB test and psoriasis follow up. Made one for tomorrow with Dr. Denna Haggard.  ?

## 2021-07-04 ENCOUNTER — Encounter: Payer: Self-pay | Admitting: Dermatology

## 2021-07-04 ENCOUNTER — Ambulatory Visit: Payer: BC Managed Care – PPO | Admitting: Dermatology

## 2021-07-04 DIAGNOSIS — L409 Psoriasis, unspecified: Secondary | ICD-10-CM

## 2021-07-04 DIAGNOSIS — R5383 Other fatigue: Secondary | ICD-10-CM

## 2021-07-04 DIAGNOSIS — Z1283 Encounter for screening for malignant neoplasm of skin: Secondary | ICD-10-CM

## 2021-07-04 DIAGNOSIS — Z86018 Personal history of other benign neoplasm: Secondary | ICD-10-CM

## 2021-07-04 DIAGNOSIS — Z79899 Other long term (current) drug therapy: Secondary | ICD-10-CM | POA: Diagnosis not present

## 2021-07-04 DIAGNOSIS — Z85828 Personal history of other malignant neoplasm of skin: Secondary | ICD-10-CM | POA: Diagnosis not present

## 2021-07-04 DIAGNOSIS — D485 Neoplasm of uncertain behavior of skin: Secondary | ICD-10-CM | POA: Diagnosis not present

## 2021-07-04 NOTE — Patient Instructions (Signed)

## 2021-07-07 LAB — QUANTIFERON-TB GOLD PLUS
Mitogen-NIL: >10 IU/mL
NIL: 0.04 IU/mL
QuantiFERON-TB Gold Plus: NEGATIVE
TB1-NIL: 0 IU/mL
TB2-NIL: 0.01 IU/mL

## 2021-07-10 ENCOUNTER — Telehealth: Payer: Self-pay

## 2021-07-10 NOTE — Telephone Encounter (Signed)
Phone call to patient with her pathology results. Patient aware of results.  

## 2021-07-10 NOTE — Telephone Encounter (Signed)
Phone call to patient with her pathology results. Voicemail left for patient to give the office a call back.  

## 2021-07-10 NOTE — Telephone Encounter (Signed)
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  There is probably some chance of local recurrence but no immediate follow-up treatment is necessary. ?

## 2021-07-17 ENCOUNTER — Telehealth: Payer: Self-pay | Admitting: Dermatology

## 2021-07-17 NOTE — Telephone Encounter (Signed)
Phone call to patient to inform her that we had been waiting on her TB results and now that we have them we will send in her prior authorization. I also informed patient that we do have some samples of Tremfya her in the office and she can come get the samples. Patient aware, patient states that she will wait to see how long the prior authorization takes and she'll call us back if she needs the samples.

## 2021-07-17 NOTE — Telephone Encounter (Signed)
Patient left message on office voice mail that she needs her Tremfya because she has already missed her dosage.  Patient stated that her insurance is denying the prior authorization and she wants to know what needs to be done.

## 2021-07-18 ENCOUNTER — Encounter: Payer: Self-pay | Admitting: Dermatology

## 2021-07-18 NOTE — Telephone Encounter (Signed)
Faxes over appeal and office notes for patients tremfya

## 2021-07-18 NOTE — Telephone Encounter (Signed)
Fax received from [employer] stating that no prior authorization is needed for the patient's Tremfya.

## 2021-07-18 NOTE — Progress Notes (Signed)
   Follow-Up Visit   Subjective  Kristin Chang is a 46 y.o. female who presents for the following: Annual Exam (Skin check h/o atypia and scc patient needs tb for psoriasis follow up patient is on tremfya ).  Psoriasis, doing well on Tremfya.  Growth on leg Location:  Duration:  Quality:  Associated Signs/Symptoms: Modifying Factors:  Severity:  Timing: Context:   Objective  Well appearing patient in no apparent distress; mood and affect are within normal limits. General skin examination: No atypical pigmented lesions (all checked with dermoscopy).  1 uncertain spot right outer thigh will be biopsied.  15% BSA small plaque psoriasis, some joint stiffness in the morning.  Poor response to topical therapy and history of reaction to Humira.  Right Thigh - Anterior Usual 1 cm pearly superficial nodule, perhaps a variant of cutaneous mucinosis         A full examination was performed including scalp, head, eyes, ears, nose, lips, neck, chest, axillae, abdomen, back, buttocks, bilateral upper extremities, bilateral lower extremities, hands, feet, fingers, toes, fingernails, and toenails. All findings within normal limits unless otherwise noted below.  Areas beneath undergarments not fully examined.   Assessment & Plan    Screening exam for skin cancer  Annual skin examination.  Psoriasis  We will try to get Tremfya approved.  Baseline labs.  Related Procedures QuantiFERON-TB Gold Plus  Encounter for long-term (current) use of medications  Related Procedures QuantiFERON-TB Gold Plus  Tired  Related Procedures QuantiFERON-TB Gold Plus  Neoplasm of uncertain behavior of skin Right Thigh - Anterior  Skin / nail biopsy Type of biopsy: tangential   Informed consent: discussed and consent obtained   Timeout: patient name, date of birth, surgical site, and procedure verified   Anesthesia: the lesion was anesthetized in a standard fashion   Anesthetic:  1%  lidocaine w/ epinephrine 1-100,000 local infiltration Instrument used: flexible razor blade   Hemostasis achieved with: ferric subsulfate   Outcome: patient tolerated procedure well   Post-procedure details: wound care instructions given    Specimen 1 - Surgical pathology Differential Diagnosis: neurofibroma, BCC, nevus lipomatosis   Check Margins: No      I, Lavonna Monarch, MD, have reviewed all documentation for this visit.  The documentation on 07/18/21 for the exam, diagnosis, procedures, and orders are all accurate and complete.

## 2021-07-19 ENCOUNTER — Telehealth: Payer: Self-pay

## 2021-07-19 NOTE — Telephone Encounter (Signed)
This encounter was created in error - please disregard.

## 2021-07-19 NOTE — Telephone Encounter (Signed)
Fax received from [facility] stating that the patients Tremfya is approved from 07/18/2021 through 07/19/2022.

## 2021-08-24 ENCOUNTER — Other Ambulatory Visit: Payer: Self-pay | Admitting: Dermatology

## 2021-11-14 ENCOUNTER — Other Ambulatory Visit: Payer: Self-pay | Admitting: Adult Health

## 2021-11-14 DIAGNOSIS — Z1231 Encounter for screening mammogram for malignant neoplasm of breast: Secondary | ICD-10-CM

## 2021-12-06 ENCOUNTER — Ambulatory Visit
Admission: RE | Admit: 2021-12-06 | Discharge: 2021-12-06 | Disposition: A | Discharge status: Home / Self Care | Payer: BC Managed Care – PPO | Source: Ambulatory Visit | Attending: Adult Health | Admitting: Adult Health

## 2021-12-06 DIAGNOSIS — Z1231 Encounter for screening mammogram for malignant neoplasm of breast: Secondary | ICD-10-CM

## 2022-01-04 ENCOUNTER — Encounter: Payer: Self-pay | Admitting: Obstetrics & Gynecology

## 2022-01-04 ENCOUNTER — Ambulatory Visit (INDEPENDENT_AMBULATORY_CARE_PROVIDER_SITE_OTHER): Payer: BC Managed Care – PPO | Admitting: Obstetrics & Gynecology

## 2022-01-04 VITALS — BP 108/74 | HR 66 | Ht 67.0 in | Wt 151.0 lb

## 2022-01-04 DIAGNOSIS — Z01419 Encounter for gynecological examination (general) (routine) without abnormal findings: Secondary | ICD-10-CM | POA: Diagnosis not present

## 2022-01-04 MED ORDER — CITALOPRAM HYDROBROMIDE 40 MG PO TABS: 40.0000 mg | ORAL_TABLET | Freq: Every day | ORAL | 3 refills | Status: DC

## 2022-01-04 NOTE — Progress Notes (Signed)
Subjective:     Kristin Chang is a 46 y.o. female here for a routine exam.  Patient's last menstrual period was 12/11/2021 (exact date). G1P0101 Birth Control Method:  Tubal ligation Menstrual Calendar(currently): regular  Current complaints: vasomotor symtpoms.   Current acute medical issues:  none   Recent Gynecologic History Patient's last menstrual period was 12/11/2021 (exact date). Last Pap: 09/09/2019,  normal Last mammogram: 12/06/21,  normal  Past Medical History:  Diagnosis Date   Atypical mole 12/12/2004   Left Upper Inner Scapula (moderate to marked) (excision)   Atypical mole 12/12/2004   Left Lower Abdomen (moderate) (widershave)   Atypical mole 03/21/2005   Right Post Shoulder (slight to moderate)   Atypical mole 11/18/2006   Left Abdomen (slight to moderate) Mindi Slicker)   Atypical mole 10/11/2010   Right Post Hip (mild)   Atypical mole 06/03/2013   Mid Back (severe) (excision)   Atypical mole 07/16/2013   Right Mid Back (moderate)   Atypical mole 07/16/2013   Left Mid Back Sup (moderate)   Atypical mole 07/16/2013   Left Mid Back Inf (moderate)   Atypical mole 07/06/2015   Right Outer Abdomen (mild)   Atypical mole 03/07/2016   Right Outer Abdomen (moderate)   Atypical mole 03/07/2016   Right Lower Back (moderate) (widershave)   Atypical mole 03/07/2016   Left Post Neck (moderate to severe) Mindi Slicker)   Atypical mole 03/31/2019   Left Upper Inner Scapula (moderate recurrent)   Depression    Psoriasis    SCCA (squamous cell carcinoma) of skin 12/15/2014   Left Shin (Keratoacanthoma) (tx p bx)    Past Surgical History:  Procedure Laterality Date   COLONOSCOPY WITH PROPOFOL N/A 12/16/2020   Procedure: COLONOSCOPY WITH PROPOFOL;  Surgeon: Eloise Harman, DO;  [facility];  Service: Endoscopy;  Laterality: N/A;  10:30 / ASA II   HYSTEROSCOPY WITH D & C  12/03/2011   Procedure: DILATATION AND CURETTAGE /HYSTEROSCOPY;  Surgeon:  Jonnie Kind, MD;  Location: [facility];  Service: Gynecology;  Laterality: N/A;  Hysteroscopy with excision of prolapsed cervical fibroid, done at [place]  12/03/2011   Procedure: LAPAROSCOPIC TUBAL LIGATION;  Surgeon: Jonnie Kind, MD;  Location: [facility];  Service: Gynecology;  Laterality: N/A;  started at [place]      OB History     Gravida  1   Para  1   Term      Preterm  1   AB      Living  1      SAB      IAB      Ectopic      Multiple      Live Births  1           Social History   Socioeconomic History   Marital status: Married    Spouse name: Not on file   Number of children: Not on file   Years of education: Not on file   Highest education level: Not on file  Occupational History   Not on file  Tobacco Use   Smoking status: Former    Years: 4.00    Types: Cigarettes   Smokeless tobacco: Never  Vaping Use   Vaping Use: Never used  Substance and Sexual Activity   Alcohol use: Not Currently    Alcohol/week: 3.0 standard drinks of alcohol    Types: 3 Cans  of beer per week    Comment: socially   Drug use: No   Sexual activity: Yes    Birth control/protection: Surgical    Comment: tubal  Other Topics Concern   Not on file  Social History Narrative   Not on file   Social Determinants of Health   Financial Resource Strain: Low Risk  (01/04/2022)   Overall Financial Resource Strain (CARDIA)    Difficulty of Paying Living Expenses: Not hard at all  Food Insecurity: No Food Insecurity (01/04/2022)   Hunger Vital Sign    Worried About Running Out of Food in the Last Year: Never true    Ran Out of Food in the Last Year: Never true  Transportation Needs: No Transportation Needs (01/04/2022)   PRAPARE - Hydrologist (Medical): No    Lack of Transportation (Non-Medical): No  Physical Activity: Sufficiently Active (01/04/2022)   Exercise Vital Sign    Days of  Exercise per Week: 4 days    Minutes of Exercise per Session: 40 min  Stress: No Stress Concern Present (01/04/2022)   [place]    Feeling of Stress : Not at all  Social Connections: Moderately Isolated (01/04/2022)   Social Connection and Isolation Panel [NHANES]    Frequency of Communication with Friends and Family: More than three times a week    Frequency of Social Gatherings with Friends and Family: Once a week    Attends Religious Services: Never    Marine scientist or Organizations: No    Attends Music therapist: Never    Marital Status: Married    Family History  Problem Relation Age of Onset   Breast cancer Mother    Cancer Mother        breast   Cancer Maternal Grandmother        colon   Heart disease Maternal Grandmother      Current Outpatient Medications:    TREMFYA 100 MG/ML SOSY, INJECT 1 SYRINGE UNDER THE SKIN EVERY 8 WEEKS., Disp: 100 mL, Rfl: 1   benzonatate (TESSALON) 100 MG capsule, Take 1-2 capsules (100-200 mg total) by mouth 3 (three) times daily as needed for cough. (Patient not taking: Reported on 01/04/2022), Disp: 60 capsule, Rfl: 0   citalopram (CELEXA) 40 MG tablet, Take 1 tablet (40 mg total) by mouth daily., Disp: 90 tablet, Rfl: 3   levocetirizine (XYZAL) 5 MG tablet, Take 1 tablet (5 mg total) by mouth every evening. (Patient not taking: Reported on 01/04/2022), Disp: 30 tablet, Rfl: 0   Na Sulfate-K Sulfate-Mg Sulf 17.5-3.13-1.6 GM/177ML SOLN, Take by mouth. (Patient not taking: Reported on 01/04/2022), Disp: , Rfl:    OVER THE COUNTER MEDICATION, Take 2 each by mouth at bedtime as needed (sleep). CBD Gummies (Patient not taking: Reported on 01/04/2022), Disp: , Rfl:    promethazine-dextromethorphan (PROMETHAZINE-DM) 6.25-15 MG/5ML syrup, Take 5 mLs by mouth at bedtime as needed for cough. (Patient not taking: Reported on 01/04/2022), Disp: 100 mL, Rfl: 0    pseudoephedrine (SUDAFED) 60 MG tablet, Take 1 tablet (60 mg total) by mouth every 8 (eight) hours as needed for congestion. (Patient not taking: Reported on 01/04/2022), Disp: 30 tablet, Rfl: 0  Review of Systems  Review of Systems  Constitutional: Negative for fever, chills, weight loss, malaise/fatigue and diaphoresis.  HENT: Negative for hearing loss, ear pain, nosebleeds, congestion, sore throat, neck pain, tinnitus and ear discharge.   Eyes:  Negative for blurred vision, double vision, photophobia, pain, discharge and redness.  Respiratory: Negative for cough, hemoptysis, sputum production, shortness of breath, wheezing and stridor.   Cardiovascular: Negative for chest pain, palpitations, orthopnea, claudication, leg swelling and PND.  Gastrointestinal: negative for abdominal pain. Negative for heartburn, nausea, vomiting, diarrhea, constipation, blood in stool and melena.  Genitourinary: Negative for dysuria, urgency, frequency, hematuria and flank pain.  Musculoskeletal: Negative for myalgias, back pain, joint pain and falls.  Skin: Negative for itching and rash.  Neurological: Negative for dizziness, tingling, tremors, sensory change, speech change, focal weakness, seizures, loss of consciousness, weakness and headaches.  Endo/Heme/Allergies: Negative for environmental allergies and polydipsia. Does not bruise/bleed easily.  Psychiatric/Behavioral: Negative for depression, suicidal ideas, hallucinations, memory loss and substance abuse. The patient is not nervous/anxious and does not have insomnia.        Objective:  Blood pressure 108/74, pulse 66, height '5\' 7"'$  (1.702 m), weight 151 lb (68.5 kg), last menstrual period 12/11/2021.   Physical Exam  Vitals reviewed. Constitutional: She is oriented to person, place, and time. She appears well-developed and well-nourished.  HENT:  Head: Normocephalic and atraumatic.        Right Ear: External ear normal.  Left Ear: External ear normal.   Nose: Nose normal.  Mouth/Throat: Oropharynx is clear and moist.  Eyes: Conjunctivae and EOM are normal. Pupils are equal, round, and reactive to light. Right eye exhibits no discharge. Left eye exhibits no discharge. No scleral icterus.  Neck: Normal range of motion. Neck supple. No tracheal deviation present. No thyromegaly present.  Cardiovascular: Normal rate, regular rhythm, normal heart sounds and intact distal pulses.  Exam reveals no gallop and no friction rub.   No murmur heard. Respiratory: Effort normal and breath sounds normal. No respiratory distress. She has no wheezes. She has no rales. She exhibits no tenderness.  GI: Soft. Bowel sounds are normal. She exhibits no distension and no mass. There is no tenderness. There is no rebound and no guarding.  Genitourinary:  Breasts no masses skin changes or nipple changes bilaterally      Vulva is normal without lesions Vagina is pink moist without discharge Cervix normal in appearance and pap is not done Uterus is normal size shape and contour Adnexa is negative with normal sized ovaries   Musculoskeletal: Normal range of motion. She exhibits no edema and no tenderness.  Neurological: She is alert and oriented to person, place, and time. She has normal reflexes. She displays normal reflexes. No cranial nerve deficit. She exhibits normal muscle tone. Coordination normal.  Skin: Skin is warm and dry. No rash noted. No erythema. No pallor.  Psychiatric: She has a normal mood and affect. Her behavior is normal. Judgment and thought content normal.       Medications Ordered at today's visit: Meds ordered this encounter  Medications   citalopram (CELEXA) 40 MG tablet    Sig: Take 1 tablet (40 mg total) by mouth daily.    Dispense:  90 tablet    Refill:  3    Other orders placed at today's visit: No orders of the defined types were placed in this encounter.     Assessment:    Normal Gyn exam.   Vasomotor symtoms Plan:     Contraception: tubal ligation. Follow up in: 1 year.     Return in about 1 year (around 01/05/2023) for yearly.

## 2022-02-06 ENCOUNTER — Ambulatory Visit (INDEPENDENT_AMBULATORY_CARE_PROVIDER_SITE_OTHER): Payer: BC Managed Care – PPO

## 2022-02-06 ENCOUNTER — Encounter: Payer: Self-pay | Admitting: Orthopaedic Surgery

## 2022-02-06 ENCOUNTER — Telehealth: Payer: Self-pay | Admitting: Radiology

## 2022-02-06 ENCOUNTER — Telehealth: Payer: Self-pay | Admitting: Orthopaedic Surgery

## 2022-02-06 ENCOUNTER — Ambulatory Visit: Payer: BC Managed Care – PPO | Admitting: Orthopaedic Surgery

## 2022-02-06 VITALS — BP 102/66 | HR 63 | Ht 67.0 in | Wt 150.4 lb

## 2022-02-06 DIAGNOSIS — M25562 Pain in left knee: Secondary | ICD-10-CM

## 2022-02-06 DIAGNOSIS — R2242 Localized swelling, mass and lump, left lower limb: Secondary | ICD-10-CM

## 2022-02-06 DIAGNOSIS — G8929 Other chronic pain: Secondary | ICD-10-CM

## 2022-02-06 NOTE — Telephone Encounter (Signed)
Patient Kristin Chang, she saw Dr. Luna Glasgow today and he was sending her for a MRI.  She called [facility] and they stated the order was written incorrectly.  Pt would like a call back.  Pt's # [phone]

## 2022-02-06 NOTE — Progress Notes (Signed)
Subjective:    Patient ID: Kristin Chang, female    DOB: March 13, 1975, 46 y.o.   MRN: [id number]  HPI She has had some swelling of the left knee medially for a while, at least many months.  She has no trauma, no pain, no redness, no numbness, no giving way.  She has noticed it has gotten slowly larger.  She has decided to have it checked out.   Review of Systems  All other systems reviewed and are negative. For Review of Systems, all other systems reviewed and are negative.  The following is a summary of the past history medically, past history surgically, known current medicines, social history and family history.  This information is gathered electronically by the computer from prior information and documentation.  I review this each visit and have found including this information at this point in the chart is beneficial and informative.   Past Medical History:  Diagnosis Date   Atypical mole 12/12/2004   Left Upper Inner Scapula (moderate to marked) (excision)   Atypical mole 12/12/2004   Left Lower Abdomen (moderate) (widershave)   Atypical mole 03/21/2005   Right Post Shoulder (slight to moderate)   Atypical mole 11/18/2006   Left Abdomen (slight to moderate) Mindi Slicker)   Atypical mole 10/11/2010   Right Post Hip (mild)   Atypical mole 06/03/2013   Mid Back (severe) (excision)   Atypical mole 07/16/2013   Right Mid Back (moderate)   Atypical mole 07/16/2013   Left Mid Back Sup (moderate)   Atypical mole 07/16/2013   Left Mid Back Inf (moderate)   Atypical mole 07/06/2015   Right Outer Abdomen (mild)   Atypical mole 03/07/2016   Right Outer Abdomen (moderate)   Atypical mole 03/07/2016   Right Lower Back (moderate) (widershave)   Atypical mole 03/07/2016   Left Post Neck (moderate to severe) Mindi Slicker)   Atypical mole 03/31/2019   Left Upper Inner Scapula (moderate recurrent)   Depression    Psoriasis    SCCA (squamous cell carcinoma) of skin 12/15/2014    Left Shin (Keratoacanthoma) (tx p bx)    Past Surgical History:  Procedure Laterality Date   COLONOSCOPY WITH PROPOFOL N/A 12/16/2020   Procedure: COLONOSCOPY WITH PROPOFOL;  Surgeon: Eloise Harman, DO;  [facility];  Service: Endoscopy;  Laterality: N/A;  10:30 / ASA II   HYSTEROSCOPY WITH D & C  12/03/2011   Procedure: DILATATION AND CURETTAGE /HYSTEROSCOPY;  Surgeon: Jonnie Kind, MD;  Location: [facility];  Service: Gynecology;  Laterality: N/A;  Hysteroscopy with excision of prolapsed cervical fibroid, done at [facility]  12/03/2011   Procedure: LAPAROSCOPIC TUBAL LIGATION;  Surgeon: Jonnie Kind, MD;  Location: [facility];  Service: Gynecology;  Laterality: N/A;  started at [place]      Current Outpatient Medications on File Prior to Visit  Medication Sig Dispense Refill   citalopram (CELEXA) 40 MG tablet Take 1 tablet (40 mg total) by mouth daily. 90 tablet 3   TREMFYA 100 MG/ML SOSY INJECT 1 SYRINGE UNDER THE SKIN EVERY 8 WEEKS. 100 mL 1   benzonatate (TESSALON) 100 MG capsule Take 1-2 capsules (100-200 mg total) by mouth 3 (three) times daily as needed for cough. (Patient not taking: Reported on 01/04/2022) 60 capsule 0   levocetirizine (XYZAL) 5 MG tablet Take 1 tablet (5 mg total) by mouth every evening. (Patient not taking: Reported on 01/04/2022)  30 tablet 0   Na Sulfate-K Sulfate-Mg Sulf 17.5-3.13-1.6 GM/177ML SOLN Take by mouth. (Patient not taking: Reported on 01/04/2022)     OVER THE COUNTER MEDICATION Take 2 each by mouth at bedtime as needed (sleep). CBD Gummies (Patient not taking: Reported on 01/04/2022)     promethazine-dextromethorphan (PROMETHAZINE-DM) 6.25-15 MG/5ML syrup Take 5 mLs by mouth at bedtime as needed for cough. (Patient not taking: Reported on 01/04/2022) 100 mL 0   pseudoephedrine (SUDAFED) 60 MG tablet Take 1 tablet (60 mg total) by mouth every 8 (eight) hours as needed for congestion. (Patient  not taking: Reported on 01/04/2022) 30 tablet 0   No current facility-administered medications on file prior to visit.    Social History   Socioeconomic History   Marital status: Married    Spouse name: Not on file   Number of children: Not on file   Years of education: Not on file   Highest education level: Not on file  Occupational History   Not on file  Tobacco Use   Smoking status: Former    Years: 4.00    Types: Cigarettes   Smokeless tobacco: Never  Vaping Use   Vaping Use: Never used  Substance and Sexual Activity   Alcohol use: Not Currently    Alcohol/week: 3.0 standard drinks of alcohol    Types: 3 Cans of beer per week    Comment: socially   Drug use: No   Sexual activity: Yes    Birth control/protection: Surgical    Comment: tubal  Other Topics Concern   Not on file  Social History Narrative   Not on file   Social Determinants of Health   Financial Resource Strain: Low Risk  (01/04/2022)   Overall Financial Resource Strain (CARDIA)    Difficulty of Paying Living Expenses: Not hard at all  Food Insecurity: No Food Insecurity (01/04/2022)   Hunger Vital Sign    Worried About Running Out of Food in the Last Year: Never true    [place] in the Last Year: Never true  Transportation Needs: No Transportation Needs (01/04/2022)   PRAPARE - Hydrologist (Medical): No    Lack of Transportation (Non-Medical): No  Physical Activity: Sufficiently Active (01/04/2022)   Exercise Vital Sign    Days of Exercise per Week: 4 days    Minutes of Exercise per Session: 40 min  Stress: No Stress Concern Present (01/04/2022)   [place]    Feeling of Stress : Not at all  Social Connections: Moderately Isolated (01/04/2022)   Social Connection and Isolation Panel [NHANES]    Frequency of Communication with Friends and Family: More than three times a week    Frequency of Social  Gatherings with Friends and Family: Once a week    Attends Religious Services: Never    Marine scientist or Organizations: No    Attends Archivist Meetings: Never    Marital Status: Married  Human resources officer Violence: Not At Risk (01/04/2022)   Humiliation, Afraid, Rape, and Kick questionnaire    Fear of Current or Ex-Partner: No    Emotionally Abused: No    Physically Abused: No    Sexually Abused: No    Family History  Problem Relation Age of Onset   Breast cancer Mother    Cancer Mother        breast   Cancer Maternal Grandmother  colon   Heart disease Maternal Grandmother     BP 102/66   Pulse 63   Ht '5\' 7"'$  (1.702 m)   Wt 150 lb 6.4 oz (68.2 kg)   SpO2 98%   BMI 23.56 kg/m   Body mass index is 23.56 kg/m.      Objective:   Physical Exam Vitals and nursing note reviewed. Exam conducted with a chaperone present.  Constitutional:      Appearance: She is well-developed.  HENT:     Head: Normocephalic and atraumatic.  Eyes:     Conjunctiva/sclera: Conjunctivae normal.     Pupils: Pupils are equal, round, and reactive to light.  Cardiovascular:     Rate and Rhythm: Normal rate and regular rhythm.  Pulmonary:     Effort: Pulmonary effort is normal.  Abdominal:     Palpations: Abdomen is soft.  Musculoskeletal:     Cervical back: Normal range of motion and neck supple.       Legs:  Skin:    General: Skin is warm and dry.  Neurological:     Mental Status: She is alert and oriented to person, place, and time.     Cranial Nerves: No cranial nerve deficit.     Motor: No abnormal muscle tone.     Coordination: Coordination normal.     Deep Tendon Reflexes: Reflexes are normal and symmetric. Reflexes normal.  Psychiatric:        Behavior: Behavior normal.        Thought Content: Thought content normal.        Judgment: Judgment normal.   X-rays were done of the left knee, reported separately.        Assessment & Plan:    Encounter Diagnosis  Name Primary?   Chronic pain of left knee Yes   I will get MRI of the left knee to see what this is.  It is about 4 inches long and about 1/2 inch wide.  Return after MRI.  Call if any problem.  Precautions discussed.  Electronically Signed Sanjuana Kava, MD 12/12/202310:00 AM

## 2022-02-06 NOTE — Patient Instructions (Addendum)
Central Scheduling [phone]  While we are working on your approval please go ahead and call to schedule your appointment to be done within one week. If you can not get an appointment at [facility] within the next week, ask if they have something sooner at [facility] or [facility] if you are able to go to [place] to have the imaging done.  AFTER you have made your imaging appointment, please call our office back at [phone] to schedule an appointment to review your results.  CURRENTLY [employer] ONLY AUTHORIZES IMAGING TO BE PERFORMED AT [facility]. THEY WILL NOT APPROVE ANY OTHER FACILITY  [facility] Address: [address] [place], [place] [address] Phone: [phone]    [address], [place] [address] Phone: [phone]  Dr.Keeling is here all day on Tuesdays, Wednesday mornings, and Thursday mornings. If you need anything such as a medication refill, please either call BEFORE the end of the day on [facility] or send a message through [place]. Your pharmacy can send a refill request for you. Calling by the end of the day on [facility] allows [place] time to send Dr.Keeling the request and for him to respond before he leaves on Thursdays.  If Dr. Luna Glasgow is out of the office, we may send it to one of the other providers and they may not refill it for the same amount that your original prescription is for.

## 2022-02-06 NOTE — Telephone Encounter (Signed)
Spoke to patient Advised MRI to be done in network at [facility], we are out of network and she is aware I gave her number ot call and schedule told her the authorization  expires on 03/07/22 and asked her to please go before then, she voiced understanding

## 2022-02-07 NOTE — Telephone Encounter (Signed)
Done, corrected and called [place] she is good to go with [facility] they will call her.

## 2022-02-07 NOTE — Telephone Encounter (Signed)
Corrected the referral / and faxed to [place] to correct the CPT code

## 2022-03-01 IMAGING — MG MM DIGITAL SCREENING BILAT W/ TOMO AND CAD
6 of 12 series · 6 of 36 positions shown · non-contrast
Comparison: Previous exam(s).

CLINICAL DATA: Screening.

EXAM:
DIGITAL SCREENING BILATERAL MAMMOGRAM WITH TOMOSYNTHESIS AND CAD
TECHNIQUE: Bilateral screening digital craniocaudal and mediolateral oblique
mammograms were obtained. Bilateral screening digital breast
tomosynthesis was performed. The images were evaluated with
computer-aided detection.

[R CC synth-2D]
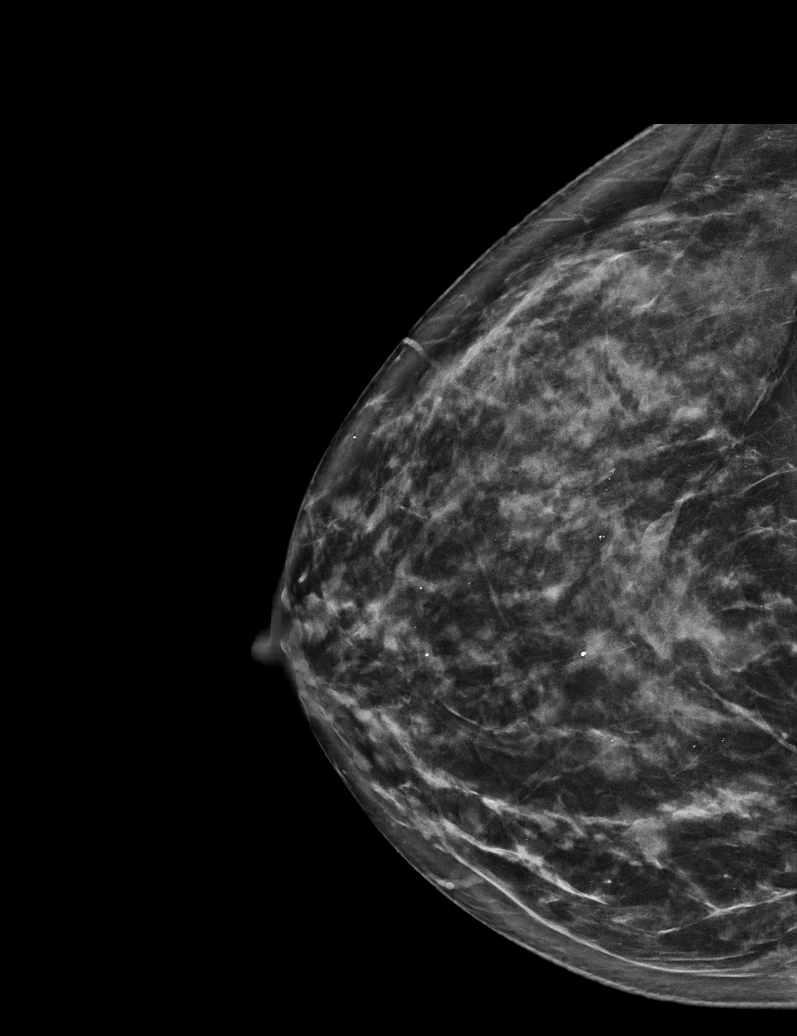

[L MLO synth-2D (1 of 2)]
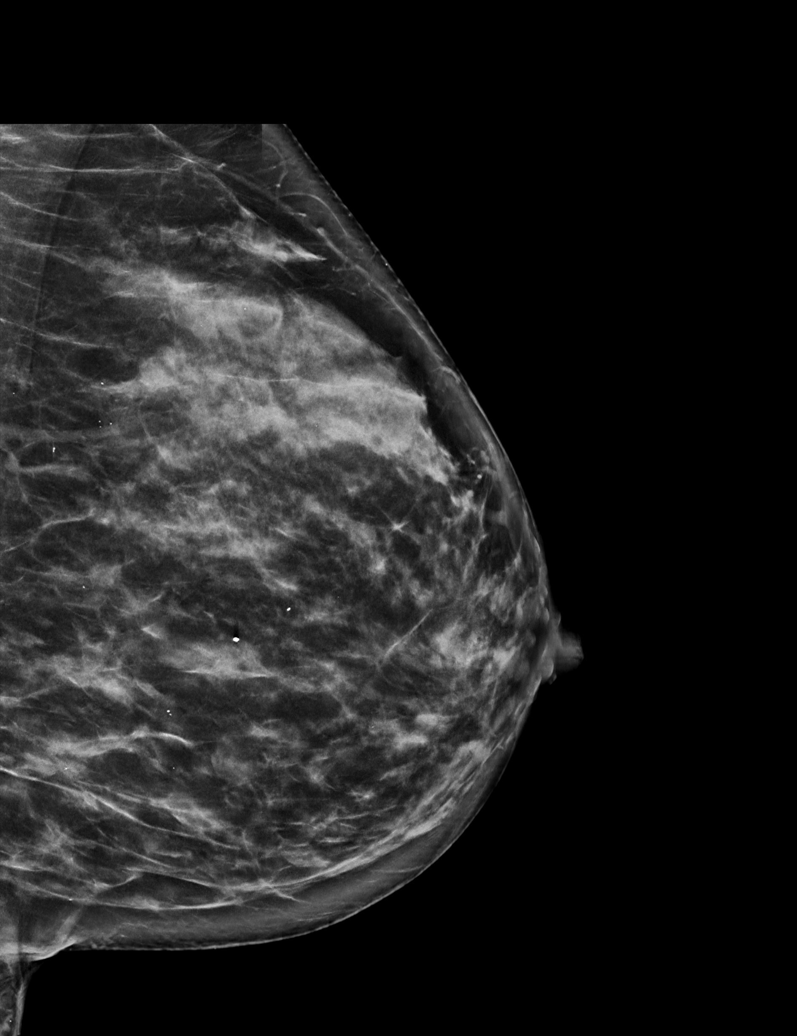

[L CC synth-2D (1 of 2)]
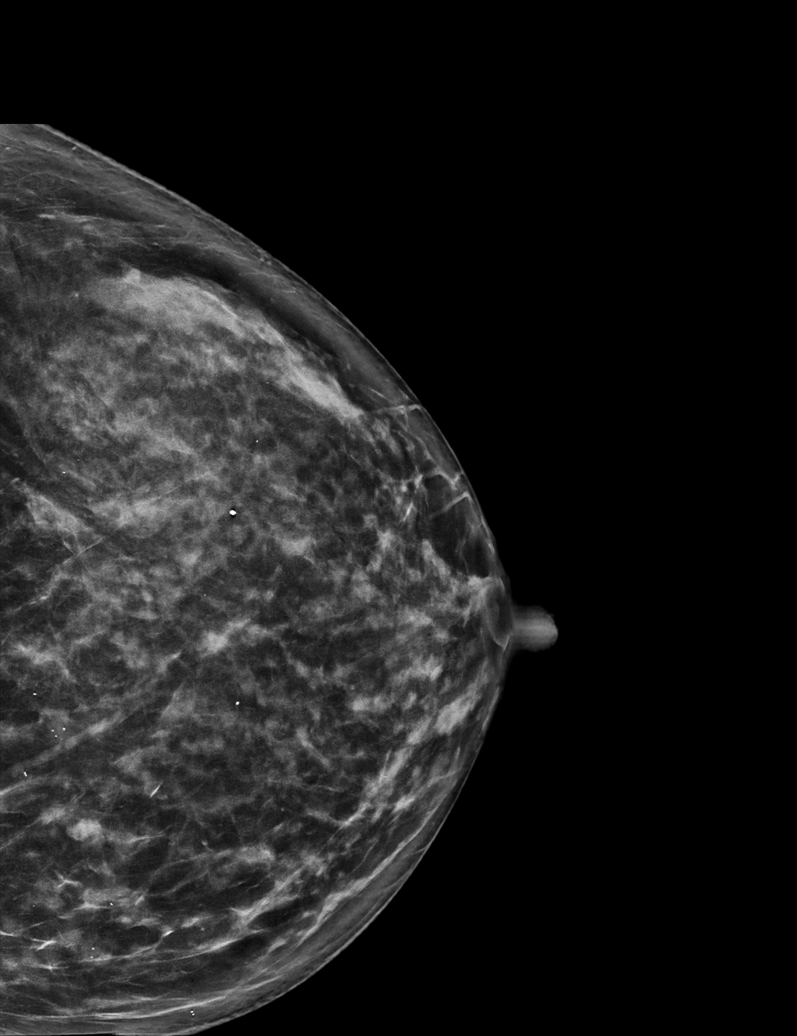

[R MLO synth-2D]
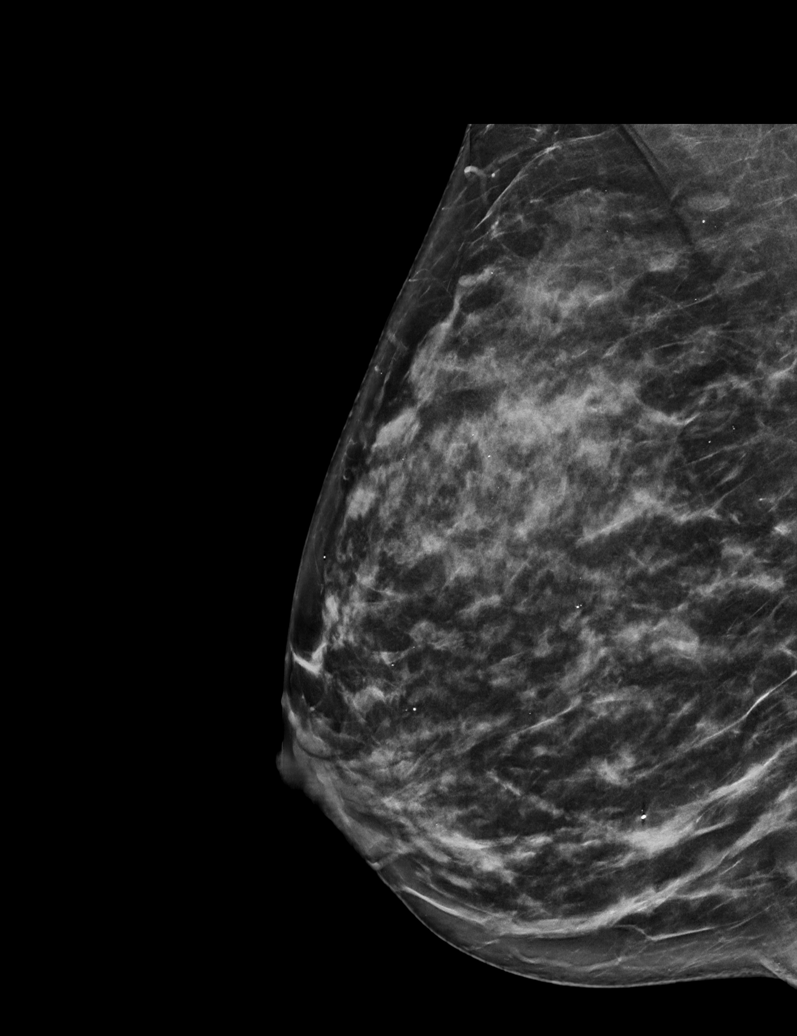

[L MLO synth-2D (2 of 2)]
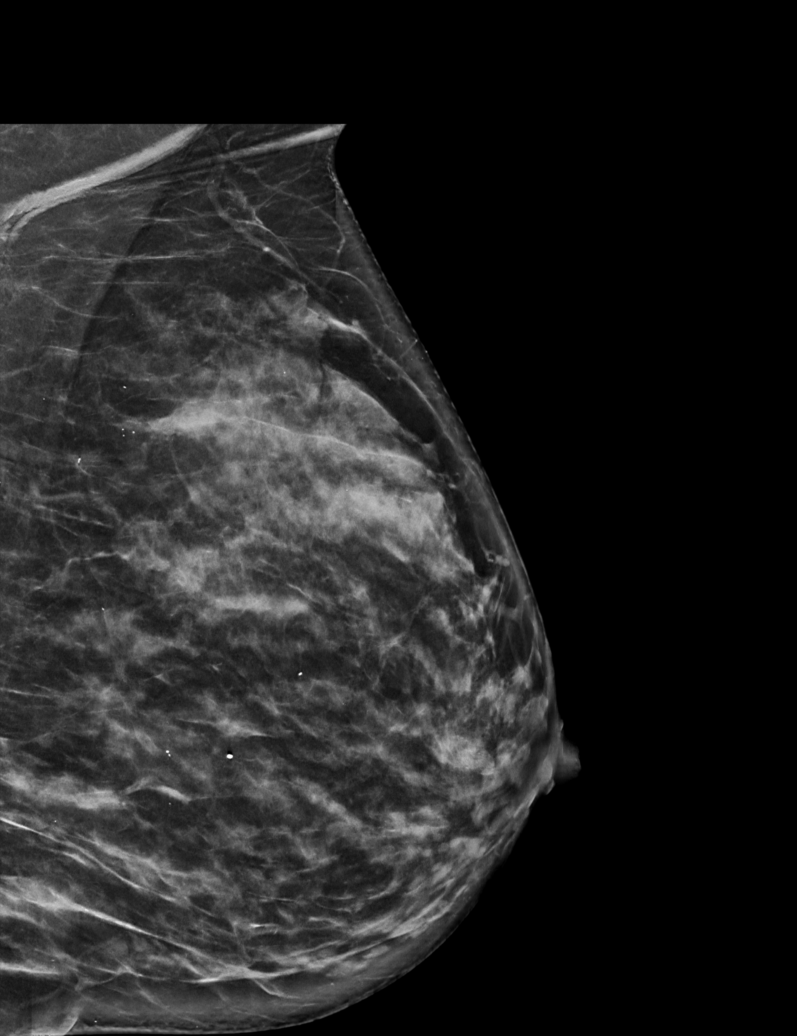

[L CC synth-2D (2 of 2)]
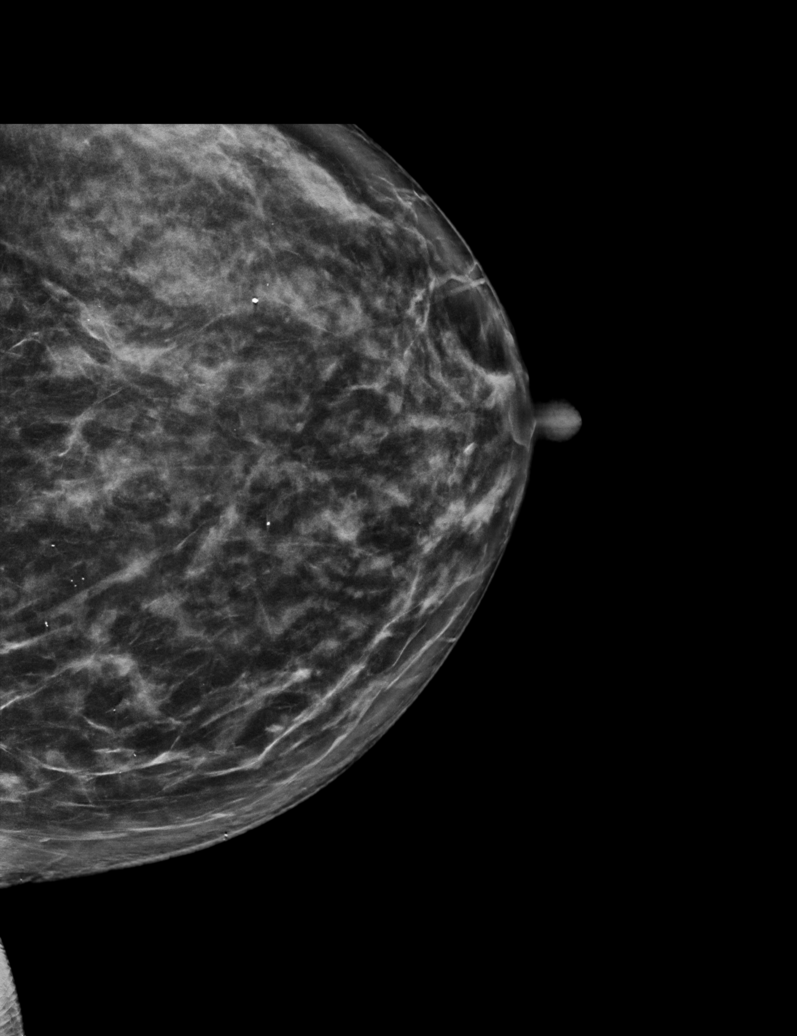

[6 of 36 positions shown; findings below may reference images not displayed]

ACR Breast Density Category c: The breast tissue is heterogeneously
dense, which may obscure small masses.
FINDINGS: There are no findings suspicious for malignancy.
IMPRESSION: No mammographic evidence of malignancy. A result letter of this
screening mammogram will be mailed directly to the patient.

RECOMMENDATION:
Screening mammogram in one year. (Code:Q3-W-BC3)

BI-RADS CATEGORY  1: Negative.

## 2022-03-06 ENCOUNTER — Encounter: Payer: Self-pay | Admitting: Orthopaedic Surgery

## 2022-03-06 ENCOUNTER — Ambulatory Visit: Payer: BC Managed Care – PPO | Admitting: Orthopaedic Surgery

## 2022-03-06 DIAGNOSIS — M23007 Cystic meniscus, unspecified meniscus, left knee: Secondary | ICD-10-CM

## 2022-03-06 DIAGNOSIS — S83242D Other tear of medial meniscus, current injury, left knee, subsequent encounter: Secondary | ICD-10-CM

## 2022-03-06 NOTE — Patient Instructions (Signed)
You have been referred to see Dr.Dean      [facility] [place] PHONE: [phone] Vito Backers is their referral coordinator and she will call you to schedule your initial visit. If you haven't heard from that office in about 1 week, please call them and schedule your appointment.

## 2022-03-06 NOTE — Progress Notes (Signed)
My knee is not hurting.  She had MRI of the left knee and it showed: Tear of junction of the posterior horn and body of the medial meniscus with medial medial displacement of the meniscus from the joint and apparent meniscal cyst.    I have explained the findings to her.  I used a model of the knee.  She would like to hold off on any surgery for now as she has no pain.  I went over precautions with her and her husband.  She has full ROM of the left knee and positive medial McMurray.  She has no limp today, no effusion.   Encounter Diagnoses  Name Primary?   Cyst of meniscus of left knee Yes   Acute meniscal tear, medial, left, subsequent encounter    I will see as needed.  Call if any problem.  Precautions discussed.  Electronically Signed Sanjuana Kava, MD 1/9/20243:35 PM

## 2022-04-26 ENCOUNTER — Encounter: Payer: Self-pay | Admitting: Radiology

## 2022-07-05 ENCOUNTER — Ambulatory Visit: Payer: BC Managed Care – PPO | Admitting: Physician Assistant

## 2022-10-31 ENCOUNTER — Other Ambulatory Visit: Payer: Self-pay | Admitting: Obstetrics & Gynecology

## 2022-10-31 DIAGNOSIS — Z1231 Encounter for screening mammogram for malignant neoplasm of breast: Secondary | ICD-10-CM

## 2022-12-11 ENCOUNTER — Ambulatory Visit: Payer: BC Managed Care – PPO

## 2022-12-23 ENCOUNTER — Other Ambulatory Visit: Payer: Self-pay | Admitting: Obstetrics & Gynecology

## 2022-12-25 ENCOUNTER — Ambulatory Visit
Admission: RE | Admit: 2022-12-25 | Discharge: 2022-12-25 | Disposition: A | Discharge status: Home / Self Care | Payer: BC Managed Care – PPO | Source: Ambulatory Visit | Attending: Obstetrics & Gynecology | Admitting: Obstetrics & Gynecology

## 2022-12-25 DIAGNOSIS — Z1231 Encounter for screening mammogram for malignant neoplasm of breast: Secondary | ICD-10-CM

## 2022-12-28 ENCOUNTER — Other Ambulatory Visit: Payer: Self-pay | Admitting: Obstetrics & Gynecology

## 2022-12-28 ENCOUNTER — Encounter: Payer: Self-pay | Admitting: Adult Health

## 2022-12-28 ENCOUNTER — Ambulatory Visit: Payer: BC Managed Care – PPO | Admitting: Adult Health

## 2022-12-28 VITALS — BP 106/64 | HR 61 | Ht 67.0 in | Wt 144.5 lb

## 2022-12-28 DIAGNOSIS — R928 Other abnormal and inconclusive findings on diagnostic imaging of breast: Secondary | ICD-10-CM

## 2022-12-28 NOTE — Progress Notes (Signed)
  Subjective:     Patient ID: Kristin Chang, female   DOB: February 12, 1976, 47 y.o.   MRN: [id number]  HPI Kristin Chang is a 47 year old white female, married, G1P0101, in to discuss abnormal mammogram. While she was waiting got a call from the [facility] and has appt 01/15/23 for right diagnotic mammogram and [place].    Component Value Date/Time   DIAGPAP  09/09/2019 0954    - Negative for intraepithelial lesion or malignancy (NILM)   DIAGPAP  08/27/2016 0000    NEGATIVE FOR INTRAEPITHELIAL LESIONS OR MALIGNANCY.   HPVHIGH Negative 09/09/2019 0954   ADEQPAP  09/09/2019 0954    Satisfactory for evaluation; transformation zone component PRESENT.   ADEQPAP  08/27/2016 0000    Satisfactory for evaluation  endocervical/transformation zone component PRESENT.    PCP is Dr Phillips Odor  Review of Systems No masses felt in breast Reviewed past medical,surgical, social and family history. Reviewed medications and allergies.     Objective:   Physical Exam BP 106/64 (BP Location: Left Arm, Patient Position: Sitting, Cuff Size: Normal)   Pulse 61   Ht 5\' 7"  (1.702 m)   Wt 144 lb 8 oz (65.5 kg)   LMP 12/17/2022   BMI 22.63 kg/m     Skin warm and dry.  Lungs: clear to ausculation bilaterally. Cardiovascular: regular rate and rhythm. Fall risk is low  Upstream - 12/28/22 1119       Pregnancy Intention Screening   Does the patient want to become pregnant in the next year? No    Does the patient's partner want to become pregnant in the next year? No    Would the patient like to discuss contraceptive options today? No      Contraception Wrap Up   Current Method Female Sterilization    End Method Female Sterilization    Contraception Counseling Provided No             Assessment:     1. Abnormal mammogram of right breast Had possible mass right breat on mammogram 12/25/22 Has follow up diagnostic mammogram right breast and [place] [facility]     Plan:     Has pap and  physical scheduled for 02/01/23

## 2022-12-31 ENCOUNTER — Other Ambulatory Visit: Payer: Self-pay | Admitting: *Deleted

## 2023-01-15 ENCOUNTER — Ambulatory Visit
Admission: RE | Admit: 2023-01-15 | Discharge: 2023-01-15 | Disposition: A | Discharge status: Home / Self Care | Payer: BC Managed Care – PPO | Source: Ambulatory Visit | Attending: Obstetrics & Gynecology | Admitting: Obstetrics & Gynecology

## 2023-01-15 DIAGNOSIS — R928 Other abnormal and inconclusive findings on diagnostic imaging of breast: Secondary | ICD-10-CM

## 2023-02-01 ENCOUNTER — Encounter: Payer: Self-pay | Admitting: Obstetrics & Gynecology

## 2023-02-01 ENCOUNTER — Ambulatory Visit (INDEPENDENT_AMBULATORY_CARE_PROVIDER_SITE_OTHER): Payer: BC Managed Care – PPO | Admitting: Obstetrics & Gynecology

## 2023-02-01 ENCOUNTER — Other Ambulatory Visit (HOSPITAL_COMMUNITY)
Admission: RE | Admit: 2023-02-01 | Discharge: 2023-02-01 | Disposition: A | Discharge status: Home / Self Care | Payer: BC Managed Care – PPO | Source: Ambulatory Visit | Attending: Obstetrics & Gynecology | Admitting: Obstetrics & Gynecology

## 2023-02-01 VITALS — BP 107/69 | HR 64 | Ht 67.0 in | Wt 141.0 lb

## 2023-02-01 DIAGNOSIS — Z01419 Encounter for gynecological examination (general) (routine) without abnormal findings: Secondary | ICD-10-CM | POA: Diagnosis not present

## 2023-02-01 DIAGNOSIS — Z01411 Encounter for gynecological examination (general) (routine) with abnormal findings: Secondary | ICD-10-CM | POA: Insufficient documentation

## 2023-02-01 NOTE — Progress Notes (Signed)
Subjective:     Kristin Chang is a 47 y.o. female here for a routine exam.  Patient's last menstrual period was 01/13/2023. G1P0101 Birth Control Method:  BTL + ablation Menstrual Calendar(currently): regular, skipped a few months ago for 4 months  Current complaints: night sweats.   Current acute medical issues:  none   Recent Gynecologic History Patient's last menstrual period was 01/13/2023. Last Pap: 08/2019,  normal Last mammogram: 11/24,  normal  Past Medical History:  Diagnosis Date   Atypical mole 12/12/2004   Left Upper Inner Scapula (moderate to marked) (excision)   Atypical mole 12/12/2004   Left Lower Abdomen (moderate) (widershave)   Atypical mole 03/21/2005   Right Post Shoulder (slight to moderate)   Atypical mole 11/18/2006   Left Abdomen (slight to moderate) Nino Glow)   Atypical mole 10/11/2010   Right Post Hip (mild)   Atypical mole 06/03/2013   Mid Back (severe) (excision)   Atypical mole 07/16/2013   Right Mid Back (moderate)   Atypical mole 07/16/2013   Left Mid Back Sup (moderate)   Atypical mole 07/16/2013   Left Mid Back Inf (moderate)   Atypical mole 07/06/2015   Right Outer Abdomen (mild)   Atypical mole 03/07/2016   Right Outer Abdomen (moderate)   Atypical mole 03/07/2016   Right Lower Back (moderate) (widershave)   Atypical mole 03/07/2016   Left Post Neck (moderate to severe) Nino Glow)   Atypical mole 03/31/2019   Left Upper Inner Scapula (moderate recurrent)   Depression    Psoriasis    SCCA (squamous cell carcinoma) of skin 12/15/2014   Left Shin (Keratoacanthoma) (tx p bx)    Past Surgical History:  Procedure Laterality Date   COLONOSCOPY WITH PROPOFOL N/A 12/16/2020   Procedure: COLONOSCOPY WITH PROPOFOL;  Surgeon: Lanelle Bal, DO;  Location: [facility];  Service: Endoscopy;  Laterality: N/A;  10:30 / ASA II   HYSTEROSCOPY WITH D & C  12/03/2011   Procedure: DILATATION AND CURETTAGE /HYSTEROSCOPY;  Surgeon:  Tilda Burrow, MD;  Location: [facility];  Service: Gynecology;  Laterality: N/A;  Hysteroscopy with excision of prolapsed cervical fibroid, done at 1342   LAPAROSCOPIC TUBAL LIGATION  12/03/2011   Procedure: LAPAROSCOPIC TUBAL LIGATION;  Surgeon: Tilda Burrow, MD;  Location: [facility];  Service: Gynecology;  Laterality: N/A;  started at 1346   LEEP  1995   TUBAL LIGATION      OB History     Gravida  1   Para  1   Term      Preterm  1   AB      Living  1      SAB      IAB      Ectopic      Multiple      Live Births  1           Social History   Socioeconomic History   Marital status: Married    Spouse name: Not on file   Number of children: Not on file   Years of education: Not on file   Highest education level: Not on file  Occupational History   Not on file  Tobacco Use   Smoking status: Former    Types: Cigarettes   Smokeless tobacco: Never  Vaping Use   Vaping status: Never Used  Substance and Sexual Activity   Alcohol use: Yes    Alcohol/week: 3.0 standard drinks of alcohol    Types: 3 Cans of  beer per week    Comment: socially   Drug use: No   Sexual activity: Yes    Birth control/protection: Surgical    Comment: tubal  Other Topics Concern   Not on file  Social History Narrative   Not on file   Social Determinants of Health   Financial Resource Strain: Low Risk  (01/04/2022)   Overall Financial Resource Strain (CARDIA)    Difficulty of Paying Living Expenses: Not hard at all  Food Insecurity: No Food Insecurity (01/04/2022)   Hunger Vital Sign    Worried About Running Out of Food in the Last Year: Never true    Ran Out of Food in the Last Year: Never true  Transportation Needs: No Transportation Needs (01/04/2022)   PRAPARE - Administrator, Civil Service (Medical): No    Lack of Transportation (Non-Medical): No  Physical Activity: Sufficiently Active (01/04/2022)   Exercise Vital Sign    Days of Exercise per Week: 4 days     Minutes of Exercise per Session: 40 min  Stress: No Stress Concern Present (01/04/2022)   [employer] of Occupational Health - Occupational Stress Questionnaire    Feeling of Stress : Not at all  Social Connections: Unknown (02/08/2022)   [facility]   Social Network    Social Network: Not on file  Recent Concern: Social Connections - Moderately Isolated (01/04/2022)   Social Connection and Isolation Panel [NHANES]    Frequency of Communication with Friends and Family: More than three times a week    Frequency of Social Gatherings with Friends and Family: Once a week    Attends Religious Services: Never    Database administrator or Organizations: No    Attends Engineer, structural: Never    Marital Status: Married    Family History  Problem Relation Age of Onset   Heart disease Maternal Grandmother    Colon cancer Maternal Grandmother    Breast cancer Mother 76 - 18   BRCA 1/2 Neg Hx      Current Outpatient Medications:    Cetirizine HCl (ZYRTEC PO), Take by mouth., Disp: , Rfl:    citalopram (CELEXA) 40 MG tablet, Take 1 tablet by mouth once daily, Disp: 90 tablet, Rfl: 0   TREMFYA 100 MG/ML SOSY, INJECT 1 SYRINGE UNDER THE SKIN EVERY 8 WEEKS., Disp: 100 mL, Rfl: 1   desonide (DESOWEN) 0.05 % cream, Apply 1 Application topically 2 (two) times daily. (Patient not taking: Reported on 02/01/2023), Disp: , Rfl:   Review of Systems  Review of Systems  Constitutional: Negative for fever, chills, weight loss, malaise/fatigue and diaphoresis.  HENT: Negative for hearing loss, ear pain, nosebleeds, congestion, sore throat, neck pain, tinnitus and ear discharge.   Eyes: Negative for blurred vision, double vision, photophobia, pain, discharge and redness.  Respiratory: Negative for cough, hemoptysis, sputum production, shortness of breath, wheezing and stridor.   Cardiovascular: Negative for chest pain, palpitations, orthopnea,  claudication, leg swelling and PND.  Gastrointestinal: negative for abdominal pain. Negative for heartburn, nausea, vomiting, diarrhea, constipation, blood in stool and melena.  Genitourinary: Negative for dysuria, urgency, frequency, hematuria and flank pain.  Musculoskeletal: Negative for myalgias, back pain, joint pain and falls.  Skin: Negative for itching and rash.  Neurological: Negative for dizziness, tingling, tremors, sensory change, speech change, focal weakness, seizures, loss of consciousness, weakness and headaches.  Endo/Heme/Allergies: Negative for environmental allergies and polydipsia. Does not bruise/bleed easily.  Psychiatric/Behavioral: Negative  for depression, suicidal ideas, hallucinations, memory loss and substance abuse. The patient is not nervous/anxious and does not have insomnia.        Objective:  Blood pressure 107/69, pulse 64, height 5\' 7"  (1.702 m), weight 141 lb (64 kg), last menstrual period 01/13/2023.   Physical Exam  Vitals reviewed. Constitutional: She is oriented to person, place, and time. She appears well-developed and well-nourished.  HENT:  Head: Normocephalic and atraumatic.        Right Ear: External ear normal.  Left Ear: External ear normal.  Nose: Nose normal.  Mouth/Throat: Oropharynx is clear and moist.  Eyes: Conjunctivae and EOM are normal. Pupils are equal, round, and reactive to light. Right eye exhibits no discharge. Left eye exhibits no discharge. No scleral icterus.  Neck: Normal range of motion. Neck supple. No tracheal deviation present. No thyromegaly present.  Cardiovascular: Normal rate, regular rhythm, normal heart sounds and intact distal pulses.  Exam reveals no gallop and no friction rub.   No murmur heard. Respiratory: Effort normal and breath sounds normal. No respiratory distress. She has no wheezes. She has no rales. She exhibits no tenderness.  GI: Soft. Bowel sounds are normal. She exhibits no distension and no mass.  There is no tenderness. There is no rebound and no guarding.  Genitourinary:  Breasts no masses skin changes or nipple changes bilaterally      Vulva is normal without lesions Vagina is pink moist without discharge Cervix normal in appearance and pap is done Uterus is normal size shape and contour Adnexa is negative with normal sized ovaries   Musculoskeletal: Normal range of motion. She exhibits no edema and no tenderness.  Neurological: She is alert and oriented to person, place, and time. She has normal reflexes. She displays normal reflexes. No cranial nerve deficit. She exhibits normal muscle tone. Coordination normal.  Skin: Skin is warm and dry. No rash noted. No erythema. No pallor.  Psychiatric: She has a normal mood and affect. Her behavior is normal. Judgment and thought content normal.       Medications Ordered at today's visit: No orders of the defined types were placed in this encounter.   Other orders placed at today's visit: No orders of the defined types were placed in this encounter.    ASSESSMENT + PLAN:    ICD-10-CM   1. Well woman exam with routine gynecological exam  Z01.419           Return in about 3 years (around 01/31/2026) for yearly.

## 2023-02-01 NOTE — Addendum Note (Signed)
Addended by: Moss Mc on: 02/01/2023 11:57 AM   Modules accepted: Orders

## 2023-02-04 LAB — CYTOLOGY - PAP
Comment: NEGATIVE
Diagnosis: NEGATIVE
High risk HPV: NEGATIVE

## 2023-04-14 ENCOUNTER — Other Ambulatory Visit: Payer: Self-pay | Admitting: Obstetrics & Gynecology

## 2023-04-15 ENCOUNTER — Encounter: Payer: Self-pay | Admitting: Adult Health

## 2023-04-15 ENCOUNTER — Ambulatory Visit: Payer: 59 | Admitting: Adult Health

## 2023-04-15 VITALS — BP 104/65 | HR 55 | Ht 67.0 in | Wt 139.4 lb

## 2023-04-15 DIAGNOSIS — N644 Mastodynia: Secondary | ICD-10-CM

## 2023-04-15 DIAGNOSIS — N61 Mastitis without abscess: Secondary | ICD-10-CM

## 2023-04-15 MED ORDER — FLUCONAZOLE 150 MG PO TABS: ORAL_TABLET | ORAL | 1 refills | Status: AC

## 2023-04-15 MED ORDER — AMOXICILLIN-POT CLAVULANATE 875-125 MG PO TABS: 1.0000 | ORAL_TABLET | Freq: Two times a day (BID) | ORAL | 0 refills | Status: AC

## 2023-04-15 NOTE — Progress Notes (Signed)
  Subjective:     Patient ID: Kristin Chang, female   DOB: Aug 24, 1975, 48 y.o.   MRN: [id number]  HPI Kristin Chang is a 48 year old white female, married, G1P1001 in complaining of pain in right breast, and is swollen, better today than yesterday. Had cyst on mammogram and [place] in November.     Component Value Date/Time   DIAGPAP  02/01/2023 1154    - Negative for intraepithelial lesion or malignancy (NILM)   DIAGPAP  09/09/2019 0954    - Negative for intraepithelial lesion or malignancy (NILM)   DIAGPAP  08/27/2016 0000    NEGATIVE FOR INTRAEPITHELIAL LESIONS OR MALIGNANCY.   HPVHIGH Negative 02/01/2023 1154   HPVHIGH Negative 09/09/2019 0954   ADEQPAP  02/01/2023 1154    Satisfactory for evaluation; transformation zone component PRESENT.   ADEQPAP  09/09/2019 0954    Satisfactory for evaluation; transformation zone component PRESENT.   ADEQPAP  08/27/2016 0000    Satisfactory for evaluation  endocervical/transformation zone component PRESENT.    PCP is Dr Phillips Odor  Review of Systems +pain in right breast +swelling in right breast  Reviewed past medical,surgical, social and family history. Reviewed medications and allergies.     Objective:   Physical Exam BP 104/65 (BP Location: Right Arm, Patient Position: Sitting, Cuff Size: Normal)   Pulse (!) 55   Ht 5\' 7"  (1.702 m)   Wt 139 lb 6.4 oz (63.2 kg)   BMI 21.83 kg/m      Skin warm and dry,  Breasts:no dominate palpable mass, retraction or nipple discharge on the left, on the right, no retraction or nipple discharge, has mass at 11 0' clock, it is tender,  and swelling LOQ of breast and mild redness.   Upstream - 04/15/23 1342       Pregnancy Intention Screening   Does the patient want to become pregnant in the next year? No    Does the patient's partner want to become pregnant in the next year? No    Would the patient like to discuss contraceptive options today? No      Contraception Wrap Up   Current Method Female  Sterilization    End Method Female Sterilization    Contraception Counseling Provided No             Assessment:     1. Breast pain, right (Primary) +right breast pain Take 2 ES tylenol and 3 ibuprofen every 6-8 hours prn pain Use ice pack Can use cold cabbage leaves too   2. Mastitis Will rx Augmentin 875-125 mg 1 bid x 10 days and will rx diflucan just in case if yeast  Meds ordered this encounter  Medications   amoxicillin-clavulanate (AUGMENTIN) 875-125 MG tablet    Sig: Take 1 tablet by mouth 2 (two) times daily.    Dispense:  20 tablet    Refill:  0    Supervising Provider:   Duane Lope H [[id number]]   fluconazole (DIFLUCAN) 150 MG tablet    Sig: Take 1 today and repeat 1 in 3 days if needed    Dispense:  2 tablet    Refill:  1    Supervising Provider:   Lazaro Arms [[id number]]   Will recheck in 8 days or sooner if needed     Plan:     Follow up in 8 days for exam

## 2023-04-18 ENCOUNTER — Telehealth: Payer: Self-pay | Admitting: Adult Health

## 2023-04-18 ENCOUNTER — Other Ambulatory Visit: Payer: Self-pay | Admitting: Adult Health

## 2023-04-18 DIAGNOSIS — N644 Mastodynia: Secondary | ICD-10-CM

## 2023-04-18 DIAGNOSIS — N61 Mastitis without abscess: Secondary | ICD-10-CM

## 2023-04-18 DIAGNOSIS — N6311 Unspecified lump in the right breast, upper outer quadrant: Secondary | ICD-10-CM

## 2023-04-18 NOTE — Progress Notes (Signed)
Will order rt diagnostic mammogram and [place] at [facility], less redness and swelling, she is nervous

## 2023-04-18 NOTE — Telephone Encounter (Signed)
Pt aware breast center closed now due to weather, will try to scheduled again in am.

## 2023-04-19 ENCOUNTER — Telehealth: Payer: Self-pay | Admitting: Adult Health

## 2023-04-19 ENCOUNTER — Inpatient Hospital Stay (HOSPITAL_COMMUNITY): Admission: RE | Admit: 2023-04-19 | Payer: 59 | Source: Ambulatory Visit

## 2023-04-19 NOTE — Telephone Encounter (Signed)
Pt has appt at breast center 05/20/23

## 2023-04-23 ENCOUNTER — Encounter: Payer: Self-pay | Admitting: Adult Health

## 2023-04-23 ENCOUNTER — Ambulatory Visit: Payer: 59 | Admitting: Adult Health

## 2023-04-23 VITALS — BP 104/49 | HR 68 | Ht 67.0 in | Wt 139.0 lb

## 2023-04-23 DIAGNOSIS — N644 Mastodynia: Secondary | ICD-10-CM

## 2023-04-23 DIAGNOSIS — N61 Mastitis without abscess: Secondary | ICD-10-CM | POA: Diagnosis not present

## 2023-04-23 DIAGNOSIS — N6311 Unspecified lump in the right breast, upper outer quadrant: Secondary | ICD-10-CM

## 2023-04-23 NOTE — Progress Notes (Signed)
  Subjective:     Patient ID: Kristin Chang, female   DOB: 03-Jun-1975, 48 y.o.   MRN: [id number]  HPI Essynce is a 48 year old white female, married, G1P0101, back in follow up on being treated for mastitis in right breast with pain and mass. She is still taking Augmentin, and feels much better.     Component Value Date/Time   DIAGPAP  02/01/2023 1154    - Negative for intraepithelial lesion or malignancy (NILM)   DIAGPAP  09/09/2019 0954    - Negative for intraepithelial lesion or malignancy (NILM)   DIAGPAP  08/27/2016 0000    NEGATIVE FOR INTRAEPITHELIAL LESIONS OR MALIGNANCY.   HPVHIGH Negative 02/01/2023 1154   HPVHIGH Negative 09/09/2019 0954   ADEQPAP  02/01/2023 1154    Satisfactory for evaluation; transformation zone component PRESENT.   ADEQPAP  09/09/2019 0954    Satisfactory for evaluation; transformation zone component PRESENT.   ADEQPAP  08/27/2016 0000    Satisfactory for evaluation  endocervical/transformation zone component PRESENT.    PCP is Dr Phillips Odor  Review of Systems Right breast feels much better, no pain some itching  Reviewed past medical,surgical, social and family history. Reviewed medications and allergies.     Objective:   Physical Exam BP (!) 104/49 (BP Location: Left Arm, Patient Position: Sitting, Cuff Size: Normal)   Pulse 68   Ht 5\' 7"  (1.702 m)   Wt 139 lb (63 kg)   LMP 04/23/2023 (Exact Date)   BMI 21.77 kg/m      Skin warm and dry,  Breasts:no dominate palpable mass, retraction or nipple discharge. The mass in right breast at 90 0'clock has resolved has regular irregularities and the tenderness, redness and swelling has resolved.  Fall risk is low  Upstream - 04/23/23 1401       Pregnancy Intention Screening   Does the patient want to become pregnant in the next year? No    Does the patient's partner want to become pregnant in the next year? No    Would the patient like to discuss contraceptive options today? No       Contraception Wrap Up   Current Method Female Sterilization    End Method Female Sterilization             Assessment:     1. Breast pain, right (Primary) Has resolved   2. Mastitis Has resolved, finish Augmentin   3. Mass of upper outer quadrant of right breast Has resolved    Plan:     Has right diagnostic mammogram and [place] 05/20/23 at [facility] keep that appointment    Follow up prn

## 2023-05-20 ENCOUNTER — Ambulatory Visit
Admission: RE | Admit: 2023-05-20 | Discharge: 2023-05-20 | Disposition: A | Discharge status: Home / Self Care | Payer: 59 | Source: Ambulatory Visit | Attending: Adult Health | Admitting: Adult Health

## 2023-05-20 DIAGNOSIS — N644 Mastodynia: Secondary | ICD-10-CM

## 2023-05-20 DIAGNOSIS — N61 Mastitis without abscess: Secondary | ICD-10-CM

## 2023-05-20 DIAGNOSIS — N6311 Unspecified lump in the right breast, upper outer quadrant: Secondary | ICD-10-CM

## 2023-10-18 ENCOUNTER — Encounter: Payer: Self-pay | Admitting: Radiology

## 2023-12-06 ENCOUNTER — Other Ambulatory Visit: Payer: Self-pay | Admitting: Obstetrics & Gynecology

## 2023-12-06 DIAGNOSIS — Z1231 Encounter for screening mammogram for malignant neoplasm of breast: Secondary | ICD-10-CM

## 2023-12-30 ENCOUNTER — Encounter: Payer: Self-pay | Admitting: Radiology

## 2023-12-31 ENCOUNTER — Ambulatory Visit
Admission: RE | Admit: 2023-12-31 | Discharge: 2023-12-31 | Disposition: A | Discharge status: Home / Self Care | Source: Ambulatory Visit | Attending: Obstetrics & Gynecology | Admitting: Obstetrics & Gynecology

## 2023-12-31 DIAGNOSIS — Z1231 Encounter for screening mammogram for malignant neoplasm of breast: Secondary | ICD-10-CM
